# Patient Record
Sex: Male | Born: 1955 | Race: White | Hispanic: No | Smoking: Current every day smoker
Health system: Southern US, Community
[De-identification: ages and names within clinical notes are randomized; demographics above are authoritative.]

## PROBLEM LIST (undated history)

## (undated) DIAGNOSIS — R21 Rash and other nonspecific skin eruption: Secondary | ICD-10-CM

## (undated) DIAGNOSIS — G5603 Carpal tunnel syndrome, bilateral upper limbs: Secondary | ICD-10-CM

## (undated) DIAGNOSIS — IMO0001 Reserved for inherently not codable concepts without codable children: Secondary | ICD-10-CM

## (undated) DIAGNOSIS — G43909 Migraine, unspecified, not intractable, without status migrainosus: Secondary | ICD-10-CM

## (undated) DIAGNOSIS — L0390 Cellulitis, unspecified: Secondary | ICD-10-CM

## (undated) DIAGNOSIS — S2239XA Fracture of one rib, unspecified side, initial encounter for closed fracture: Secondary | ICD-10-CM

## (undated) DIAGNOSIS — R059 Cough, unspecified: Secondary | ICD-10-CM

## (undated) DIAGNOSIS — R05 Cough: Secondary | ICD-10-CM

## (undated) DIAGNOSIS — M419 Scoliosis, unspecified: Secondary | ICD-10-CM

## (undated) HISTORY — PX: NO PAST SURGERIES: SHX2092

---

## 2005-06-10 ENCOUNTER — Emergency Department: Payer: Self-pay | Admitting: General Practice

## 2005-12-13 ENCOUNTER — Emergency Department: Payer: Self-pay | Admitting: Unknown Physician Specialty

## 2005-12-13 ENCOUNTER — Other Ambulatory Visit: Payer: Self-pay

## 2007-01-29 ENCOUNTER — Emergency Department: Payer: Self-pay | Admitting: Emergency Medicine

## 2008-06-11 ENCOUNTER — Emergency Department: Payer: Self-pay | Admitting: Emergency Medicine

## 2014-08-11 ENCOUNTER — Encounter: Payer: Self-pay | Admitting: Medical Oncology

## 2014-08-11 ENCOUNTER — Emergency Department
Admission: EM | Admit: 2014-08-11 | Discharge: 2014-08-11 | Disposition: A | Discharge status: Home / Self Care | Payer: Self-pay | Attending: Emergency Medicine | Admitting: Emergency Medicine

## 2014-08-11 DIAGNOSIS — Z7952 Long term (current) use of systemic steroids: Secondary | ICD-10-CM | POA: Insufficient documentation

## 2014-08-11 DIAGNOSIS — Z880 Allergy status to penicillin: Secondary | ICD-10-CM | POA: Insufficient documentation

## 2014-08-11 DIAGNOSIS — G43009 Migraine without aura, not intractable, without status migrainosus: Secondary | ICD-10-CM | POA: Insufficient documentation

## 2014-08-11 DIAGNOSIS — Z720 Tobacco use: Secondary | ICD-10-CM | POA: Insufficient documentation

## 2014-08-11 DIAGNOSIS — R05 Cough: Secondary | ICD-10-CM | POA: Insufficient documentation

## 2014-08-11 HISTORY — DX: Scoliosis, unspecified: M41.9

## 2014-08-11 HISTORY — DX: Carpal tunnel syndrome, bilateral upper limbs: G56.03

## 2014-08-11 HISTORY — DX: Migraine, unspecified, not intractable, without status migrainosus: G43.909

## 2014-08-11 MED ORDER — KETOROLAC TROMETHAMINE 60 MG/2ML IM SOLN
INTRAMUSCULAR | Status: AC
  Administered 2014-08-11: 60 mg via INTRAMUSCULAR
  Filled 2014-08-11: qty 2

## 2014-08-11 MED ORDER — DIPHENHYDRAMINE HCL 25 MG PO CAPS
ORAL_CAPSULE | ORAL | Status: AC
  Administered 2014-08-11: 50 mg via ORAL
  Filled 2014-08-11: qty 2

## 2014-08-11 MED ORDER — METOCLOPRAMIDE HCL 10 MG PO TABS
ORAL_TABLET | ORAL | Status: AC
Start: 2014-08-11 — End: 2014-08-11
  Administered 2014-08-11: 20 mg via ORAL
  Filled 2014-08-11: qty 2

## 2014-08-11 MED ORDER — METOCLOPRAMIDE HCL 10 MG PO TABS: 10.0000 mg | ORAL_TABLET | Freq: Three times a day (TID) | ORAL | Status: DC

## 2014-08-11 MED ORDER — KETOROLAC TROMETHAMINE 60 MG/2ML IM SOLN
60.0000 mg | Freq: Once | INTRAMUSCULAR | Status: AC
  Administered 2014-08-11: 60 mg via INTRAMUSCULAR

## 2014-08-11 MED ORDER — DIPHENHYDRAMINE HCL 25 MG PO CAPS: 50.0000 mg | ORAL_CAPSULE | Freq: Four times a day (QID) | ORAL | Status: DC | PRN

## 2014-08-11 MED ORDER — METOCLOPRAMIDE HCL 10 MG PO TABS
20.0000 mg | ORAL_TABLET | Freq: Once | ORAL | Status: AC
  Administered 2014-08-11: 20 mg via ORAL

## 2014-08-11 MED ORDER — DIPHENHYDRAMINE HCL 25 MG PO CAPS
50.0000 mg | ORAL_CAPSULE | Freq: Once | ORAL | Status: AC
  Administered 2014-08-11: 50 mg via ORAL

## 2014-08-11 MED ORDER — TRIAMCINOLONE ACETONIDE 0.05 % EX OINT: 1.0000 "application " | TOPICAL_OINTMENT | Freq: Two times a day (BID) | CUTANEOUS | Status: AC

## 2014-08-11 NOTE — ED Notes (Signed)
Pt alert and oriented X4, active, cooperative, pt in NAD. RR even and unlabored, color WNL.  Pt informed to return if any life threatening symptoms occur.  Left with mother. 

## 2014-08-11 NOTE — Discharge Instructions (Signed)

## 2014-08-11 NOTE — ED Notes (Signed)
Pt ambulatory to triage with reports of migraine headache that began last night. Pt reports vomit x 2 since last night.

## 2014-08-11 NOTE — ED Provider Notes (Signed)
[facility] Emergency Department Provider Note  ____________________________________________  Time seen: 2:50 PM  I have reviewed the triage vital signs and the nursing notes.   HISTORY  Chief Complaint Headache and Emesis    HPI Lee Fischer is a 59 y.o. male who complains of recurrent migraine headache since last night. He notes that this migraine headache is exactly like his usual ones characterized by generalized severe headache, intermittent tremor, nausea, vomiting, and feeling like he goes cross eyed. Normally he takes Advil and lays under the covers and his bed and feels better. However, this headache has not improved and apart from being refractory to his usual treatment at home, there are no other features. No numbness tingling or weakness, or vision changes, or sudden thunderclap headache.     Past Medical History  Diagnosis Date  . Migraines   . Scoliosis   . Carpal tunnel syndrome, bilateral     There are no active problems to display for this patient.   History reviewed. No pertinent past surgical history.  Current Outpatient Rx  Name  Route  Sig  Dispense  Refill  . diphenhydrAMINE (BENADRYL) 25 mg capsule   Oral   Take 2 capsules (50 mg total) by mouth every 6 (six) hours as needed.   60 capsule   0   . metoCLOPramide (REGLAN) 10 MG tablet   Oral   Take 1 tablet (10 mg total) by mouth 4 (four) times daily -  before meals and at bedtime.   60 tablet   0   . TRIAMCINOLONE ACETONIDE, TOP, 0.05 % OINT   Apply externally   Apply 1 application topically 2 (two) times daily.   15 g   0     Allergies Penicillins  No family history on file.  Social History History  Substance Use Topics  . Smoking status: Current Every Day Smoker  . Smokeless tobacco: Not on file  . Alcohol Use: No    Review of Systems  Constitutional: No fever or chills. No weight changes Eyes:No blurry vision or double vision.  ENT: No sore  throat. Cardiovascular: No chest pain. Respiratory: Chronic cough due to smoking. Gastrointestinal: Negative for abdominal pain, vomiting and diarrhea.  No BRBPR or melena. Genitourinary: Negative for dysuria, urinary retention, bloody urine, or difficulty urinating. Musculoskeletal: Negative for back pain. No joint swelling or pain. Skin: Negative for rash. Neurological: Headache as above, no weakness or paresthesia Psychiatric:No anxiety or depression.   Endocrine:No hot/cold intolerance, changes in energy, or sleep difficulty.  10-point ROS otherwise negative.  ____________________________________________   PHYSICAL EXAM:  VITAL SIGNS: ED Triage Vitals  Enc Vitals Group     BP 08/11/14 1358 121/73 mmHg     Pulse Rate 08/11/14 1358 91     Resp 08/11/14 1358 18     Temp 08/11/14 1358 100.1 F (37.8 C)     Temp Source 08/11/14 1358 Oral     SpO2 08/11/14 1358 97 %     Weight 08/11/14 1358 132 lb (59.875 kg)     Height 08/11/14 1358 5\' 6"  (1.676 m)     Head Cir --      Peak Flow --      Pain Score 08/11/14 1359 10     Pain Loc --      Pain Edu? --      Excl. in GC? --      Constitutional: Alert and oriented. Well appearing and in no distress. Eyes: No scleral  icterus. No conjunctival pallor. PERRL. EOMI ENT   Head: Normocephalic and atraumatic.   Nose: No congestion/rhinnorhea. No septal hematoma   Mouth/Throat: MMM, no pharyngeal erythema. No peritonsillar mass. No uvula shift.   Neck: No stridor. No SubQ emphysema. No meningismus. Hematological/Lymphatic/Immunilogical: No cervical lymphadenopathy. Cardiovascular: RRR. Normal and symmetric distal pulses are present in all extremities. No murmurs, rubs, or gallops. Respiratory: Normal respiratory effort without tachypnea nor retractions. Breath sounds are clear and equal bilaterally. No wheezes/rales/rhonchi. Gastrointestinal: Soft and nontender. No distention. There is no CVA tenderness.  No rebound,  rigidity, or guarding. Genitourinary: deferred Musculoskeletal: Nontender with normal range of motion in all extremities. No joint effusions.  No lower extremity tenderness.  No edema. Neurologic:   Normal speech and language.  CN 2-10 normal. Motor grossly intact. No pronator drift.  Normal gait. No gross focal neurologic deficits are appreciated.  Skin:  Skin is warm, dry and intact. Small well delineated rectangular shaped rash on the left medial distal shin, consistent with contact dermatitis. No weeping and oozing or crusting. Nontender Psychiatric: Mood and affect are normal. Speech and behavior are normal. Patient exhibits appropriate insight and judgment.  ____________________________________________    LABS (pertinent positives/negatives) (all labs ordered are listed, but only abnormal results are displayed) Labs Reviewed - No data to display ____________________________________________   EKG    ____________________________________________    RADIOLOGY    ____________________________________________   PROCEDURES  ____________________________________________   INITIAL IMPRESSION / ASSESSMENT AND PLAN / ED COURSE  Pertinent labs & imaging results that were available during my care of the patient were reviewed by me and considered in my medical decision making (see chart for details).  Evaluation consistent with migraine headache. Low suspicion of CVA, ICH, meningitis, encephalitis, carotid injury, glaucoma or giant cell arteritis. We'll give him Toradol Benadryl and Reglan and discharged home. In addition, all present prescribed triamcinolone ointment for the contact dermatitis on his left shin. No tick exposure or high risk outdoor activities. ____________________________________________   FINAL CLINICAL IMPRESSION(S) / ED DIAGNOSES  Final diagnoses:  Migraine without aura and without status migrainosus, not intractable   contact dermatitis of left  shin    Sharman Cheek, MD 08/11/14 1506

## 2014-08-14 ENCOUNTER — Encounter: Payer: Self-pay | Admitting: Emergency Medicine

## 2014-08-14 ENCOUNTER — Emergency Department: Payer: Self-pay

## 2014-08-14 ENCOUNTER — Emergency Department
Admission: EM | Admit: 2014-08-14 | Discharge: 2014-08-14 | Disposition: A | Discharge status: Home / Self Care | Payer: Self-pay | Attending: Emergency Medicine | Admitting: Emergency Medicine

## 2014-08-14 DIAGNOSIS — Z79899 Other long term (current) drug therapy: Secondary | ICD-10-CM | POA: Insufficient documentation

## 2014-08-14 DIAGNOSIS — L03116 Cellulitis of left lower limb: Secondary | ICD-10-CM | POA: Insufficient documentation

## 2014-08-14 DIAGNOSIS — Z720 Tobacco use: Secondary | ICD-10-CM | POA: Insufficient documentation

## 2014-08-14 DIAGNOSIS — Z880 Allergy status to penicillin: Secondary | ICD-10-CM | POA: Insufficient documentation

## 2014-08-14 LAB — CBC WITH DIFFERENTIAL/PLATELET
Basophils Absolute: 0.3 10*3/uL — ABNORMAL HIGH (ref 0–0.1)
Basophils Relative: 2 %
Eosinophils Absolute: 0 10*3/uL (ref 0–0.7)
Eosinophils Relative: 0 %
HCT: 45.6 % (ref 40.0–52.0)
Hemoglobin: 15.4 g/dL (ref 13.0–18.0)
Lymphocytes Relative: 18 %
Lymphs Abs: 2.1 10*3/uL (ref 1.0–3.6)
MCH: 32.7 pg (ref 26.0–34.0)
MCHC: 33.7 g/dL (ref 32.0–36.0)
MCV: 96.9 fL (ref 80.0–100.0)
MONO ABS: 0.9 10*3/uL (ref 0.2–1.0)
Monocytes Relative: 8 %
NEUTROS PCT: 72 %
Neutro Abs: 8.5 10*3/uL — ABNORMAL HIGH (ref 1.4–6.5)
PLATELETS: 191 10*3/uL (ref 150–440)
RBC: 4.71 MIL/uL (ref 4.40–5.90)
RDW: 13.2 % (ref 11.5–14.5)
WBC: 11.7 10*3/uL — ABNORMAL HIGH (ref 3.8–10.6)

## 2014-08-14 LAB — COMPREHENSIVE METABOLIC PANEL
ALK PHOS: 70 U/L (ref 38–126)
ALT: 23 U/L (ref 17–63)
AST: 25 U/L (ref 15–41)
Albumin: 4.1 g/dL (ref 3.5–5.0)
Anion gap: 10 (ref 5–15)
BILIRUBIN TOTAL: 0.7 mg/dL (ref 0.3–1.2)
BUN: 22 mg/dL — AB (ref 6–20)
CO2: 29 mmol/L (ref 22–32)
Calcium: 9.2 mg/dL (ref 8.9–10.3)
Chloride: 101 mmol/L (ref 101–111)
Creatinine, Ser: 1.12 mg/dL (ref 0.61–1.24)
GFR calc non Af Amer: >60 mL/min (ref >60)
Glucose, Bld: 105 mg/dL — ABNORMAL HIGH (ref 65–99)
Potassium: 3.2 mmol/L — ABNORMAL LOW (ref 3.5–5.1)
SODIUM: 140 mmol/L (ref 135–145)
Total Protein: 7.9 g/dL (ref 6.5–8.1)

## 2014-08-14 MED ORDER — SULFAMETHOXAZOLE-TRIMETHOPRIM 800-160 MG PO TABS: 1.0000 | ORAL_TABLET | Freq: Two times a day (BID) | ORAL | Status: DC

## 2014-08-14 MED ORDER — MUPIROCIN 2 % EX OINT: TOPICAL_OINTMENT | CUTANEOUS | Status: AC

## 2014-08-14 MED ORDER — SULFAMETHOXAZOLE-TRIMETHOPRIM 800-160 MG PO TABS
1.0000 | ORAL_TABLET | Freq: Once | ORAL | Status: AC
  Administered 2014-08-14: 1 via ORAL

## 2014-08-14 MED ORDER — OXYCODONE-ACETAMINOPHEN 5-325 MG PO TABS: 1.0000 | ORAL_TABLET | Freq: Four times a day (QID) | ORAL | Status: DC | PRN

## 2014-08-14 MED ORDER — SULFAMETHOXAZOLE-TRIMETHOPRIM 800-160 MG PO TABS
ORAL_TABLET | ORAL | Status: AC
  Administered 2014-08-14: 1 via ORAL
  Filled 2014-08-14: qty 1

## 2014-08-14 NOTE — ED Notes (Signed)
Pt redness, swelling and warmth to left lower leg. Cold chills unknown fever.

## 2014-08-14 NOTE — Discharge Instructions (Signed)

## 2014-08-14 NOTE — ED Provider Notes (Signed)
[facility] Emergency Department Provider Note     Time seen: ----------------------------------------- 3:50 PM on 08/14/2014 -----------------------------------------    I have reviewed the triage vital signs and the nursing notes.   HISTORY  Chief Complaint Leg Swelling    HPI Lee Fischer is a 59 y.o. male who presents ER for for warmth and swelling of his left lower leg since February.Patient states over the last few days skin increasingly red, with warmth. Family's concern he has infection there. Recently was seen here and was treated for allergic type dermatitis with steroids and antihistamines. Denies fevers chills or other complaints, but was noted to have a temperature 100.1 here. Symptoms are mild to moderate this time, nothing makes it better or worse.    Past Medical History  Diagnosis Date  . Migraines   . Scoliosis   . Carpal tunnel syndrome, bilateral     There are no active problems to display for this patient.   History reviewed. No pertinent past surgical history.  Current Outpatient Rx  Name  Route  Sig  Dispense  Refill  . diphenhydrAMINE (BENADRYL) 25 mg capsule   Oral   Take 2 capsules (50 mg total) by mouth every 6 (six) hours as needed.   60 capsule   0   . metoCLOPramide (REGLAN) 10 MG tablet   Oral   Take 1 tablet (10 mg total) by mouth 4 (four) times daily -  before meals and at bedtime.   60 tablet   0   . TRIAMCINOLONE ACETONIDE, TOP, 0.05 % OINT   Apply externally   Apply 1 application topically 2 (two) times daily.   15 g   0     Allergies Penicillins  No family history on file.  Social History History  Substance Use Topics  . Smoking status: Current Every Day Smoker -- 1.00 packs/day    Types: Cigarettes  . Smokeless tobacco: Never Used  . Alcohol Use: No    Review of Systems Constitutional: Negative for fever. Eyes: Negative for visual changes. ENT: Negative for sore  throat. Cardiovascular: Negative for chest pain. Respiratory: Negative for shortness of breath. Gastrointestinal: Negative for abdominal pain, vomiting and diarrhea. Genitourinary: Negative for dysuria. Musculoskeletal: Negative for back pain. Skin: Positive for rash and redness to left lower extremity Neurological: Negative for headaches, focal weakness or numbness.  10-point ROS otherwise negative.  ____________________________________________   PHYSICAL EXAM:  VITAL SIGNS: ED Triage Vitals  Enc Vitals Group     BP 08/14/14 1518 123/71 mmHg     Pulse Rate 08/14/14 1518 89     Resp 08/14/14 1518 20     Temp 08/14/14 1518 98.6 F (37 C)     Temp Source 08/14/14 1518 Oral     SpO2 08/14/14 1518 99 %     Weight 08/14/14 1518 140 lb (63.504 kg)     Height 08/14/14 1518  (1.676 m)     Head Cir --      Peak Flow --      Pain Score 08/14/14 1525 10     Pain Loc --      Pain Edu? --      Excl. in GC? --     Constitutional: Alert and oriented. Well appearing and in no distress. Eyes: Conjunctivae are normal. PERRL. Normal extraocular movements. ENT   Head: Normocephalic and atraumatic.   Nose: No congestion/rhinnorhea.   Mouth/Throat: Mucous membranes are moist.   Neck: No stridor. Hematological/Lymphatic/Immunilogical: No  cervical lymphadenopathy. Cardiovascular: Normal rate, regular rhythm. Normal and symmetric distal pulses are present in all extremities. No murmurs, rubs, or gallops. Respiratory: Normal respiratory effort without tachypnea nor retractions. Breath sounds are clear and equal bilaterally. No wheezes/rales/rhonchi. Gastrointestinal: Soft and nontender. No distention. No abdominal bruits. There is no CVA tenderness. Musculoskeletal: Nontender with normal range of motion in all extremities. No joint effusions.  No lower extremity tenderness nor edema. Neurologic:  Normal speech and language. No gross focal neurologic deficits are appreciated.  Speech is normal. No gait instability. Skin:  There is near circular circumferential erythema and left lower extremity just above the ankle., There is also warmth over the area laterally. It's about the size of the palmar surface of the hand. Psychiatric: Mood and affect are normal. Speech and behavior are normal. Patient exhibits appropriate insight and judgment.  ____________________________________________    LABS (pertinent positives/negatives)  Labs Reviewed  CBC WITH DIFFERENTIAL/PLATELET - Abnormal; Notable for the following:    WBC 11.7 (*)    Neutro Abs 8.5 (*)    Basophils Absolute 0.3 (*)    All other components within normal limits  COMPREHENSIVE METABOLIC PANEL - Abnormal; Notable for the following:    Potassium 3.2 (*)    Glucose, Bld 105 (*)    BUN 22 (*)    All other components within normal limits  CULTURE, BLOOD (ROUTINE X 2)  CULTURE, BLOOD (ROUTINE X 2)    ____________________________________________  ED COURSE:  Pertinent labs & imaging results that were available during my care of the patient were reviewed by me and considered in my medical decision making (see chart for details). Patient will receive lab work, cultures, ultrasound.  ____________________________________________   RADIOLOGY  Ultrasound left lower extremity for DVT is negative  IMPRESSION: No evidence of deep venous thrombosis.   ____________________________________________    FINAL ASSESSMENT AND PLAN  Cellulitis   Plan: Patient will continue outpatient follow-up with antibiotics and antibiotic ointment. We'll referred to the wound healing Center for evaluation.    Emily FilbertWilliams, Triana Coover E, MD   Emily FilbertJonathan E Leilanee Righetti, MD 08/14/14 [id number]

## 2014-08-19 LAB — CULTURE, BLOOD (ROUTINE X 2)
CULTURE: NO GROWTH
Culture: NO GROWTH

## 2014-08-29 ENCOUNTER — Encounter: Payer: Self-pay | Attending: Surgery | Admitting: Surgery

## 2014-08-29 DIAGNOSIS — L03116 Cellulitis of left lower limb: Secondary | ICD-10-CM | POA: Insufficient documentation

## 2014-08-29 NOTE — Progress Notes (Signed)
JAYMIAN, BOGART ([id number]) Visit Report for 08/29/2014 Allergy List Details Patient Name: Lee Fischer, Lee Fischer Date of Service: 08/29/2014 2:30 PM Medical Record Number: [id number] Patient Account Number: [id number] Date of Birth/Sex: 1955-06-21 (59 y.o. Male) Treating RN: Clover Mealy, RN, BSN, [place] Sink Primary Care Physician: Other Clinician: Referring Physician: Treating Physician/Extender: Rudene Re in Treatment: 0 Allergies Active Allergies No known drug allergies Allergy Notes Electronic Signature(s) Signed: 08/29/2014 3:19:52 PM By: Elpidio Eric BSN, RN Entered By: Elpidio Eric on 08/29/2014 14:46:14 Lee Fischer ([id number]) -------------------------------------------------------------------------------- Arrival Information Details Patient Name: Lee Fischer Date of Service: 08/29/2014 2:30 PM Medical Record Number: [id number] Patient Account Number: [id number] Date of Birth/Sex: [birth year] (59 y.o. Male) Treating RN: Clover Mealy, RN, BSN, [place] Sink Primary Care Physician: Other Clinician: Referring Physician: Treating Physician/Extender: Rudene Re in Treatment: 0 Visit Information Patient Arrived: Ambulatory Arrival Time: 14:41 Accompanied By: mommy Transfer Assistance: None Patient Identification Verified: Yes Secondary Verification Process Yes Completed: Patient Requires Transmission-Based No Precautions: Patient Has Alerts: No Electronic Signature(s) Signed: 08/29/2014 3:19:52 PM By: Elpidio Eric BSN, RN Entered By: Elpidio Eric on 08/29/2014 14:42:22 Lee Fischer ([id number]) -------------------------------------------------------------------------------- Clinic Level of Care Assessment Details Patient Name: Lee Fischer Date of Service: 08/29/2014 2:30 PM Medical Record Number: [id number] Patient Account Number: [id number] Date of Birth/Sex: 14-Dec-1955 (59 y.o. Male) Treating RN: Curtis Sites Primary Care Physician: Other  Clinician: Referring Physician: Treating Physician/Extender: Rudene Re in Treatment: 0 Clinic Level of Care Assessment Items TOOL 2 Quantity Score  - Use when only an EandM is performed on the INITIAL visit 0 ASSESSMENTS - Nursing Assessment / Reassessment X - General Physical Exam (combine w/ comprehensive assessment (listed just 1 20 below) when performed on new pt. evals) X - Comprehensive Assessment (HX, ROS, Risk Assessments, Wounds Hx, etc.) 1 25 ASSESSMENTS - Wound and Skin Assessment / Reassessment  - Simple Wound Assessment / Reassessment - one wound 0  - Complex Wound Assessment / Reassessment - multiple wounds 0  - Dermatologic / Skin Assessment (not related to wound area) 0 ASSESSMENTS - Ostomy and/or Continence Assessment and Care  - Incontinence Assessment and Management 0  - Ostomy Care Assessment and Management (repouching, etc.) 0 PROCESS - Coordination of Care X - Simple Patient / Family Education for ongoing care 1 15  - Complex (extensive) Patient / Family Education for ongoing care 0  - Staff obtains Chiropractor, Records, Test Results / Process Orders 0  - Staff telephones HHA, Nursing Homes / Clarify orders / etc 0  - Routine Transfer to another Facility (non-emergent condition) 0  - Routine Hospital Admission (non-emergent condition) 0  - New Admissions / Manufacturing engineer / Ordering NPWT, Apligraf, etc. 0  - Emergency Hospital Admission (emergent condition) 0 X - Simple Discharge Coordination 1 10 Lee Fischer, Lee Fischer. ([id number])  - Complex (extensive) Discharge Coordination 0 PROCESS - Special Needs  - Pediatric / Minor Patient Management 0  - Isolation Patient Management 0  - Hearing / Language / Visual special needs 0  - Assessment of Community assistance (transportation, D/C planning, etc.) 0  - Additional assistance / Altered mentation 0  - Support Surface(s) Assessment (bed, cushion, seat, etc.)  0 INTERVENTIONS - Wound Cleansing / Measurement  - Wound Imaging (photographs - any number of wounds) 0  - Wound Tracing (instead of photographs) 0  - Simple Wound Measurement - one wound 0  - Complex Wound Measurement - multiple wounds 0  - Simple Wound Cleansing - one wound 0  -  Complex Wound Cleansing - multiple wounds 0 INTERVENTIONS - Wound Dressings  - Small Wound Dressing one or multiple wounds 0  - Medium Wound Dressing one or multiple wounds 0  - Large Wound Dressing one or multiple wounds 0  - Application of Medications - injection 0 INTERVENTIONS - Miscellaneous  - External ear exam 0  - Specimen Collection (cultures, biopsies, blood, body fluids, etc.) 0  - Specimen(s) / Culture(s) sent or taken to Lab for analysis 0  - Patient Transfer (multiple staff / Michiel Sites Lift / Similar devices) 0  - Simple Staple / Suture removal (25 or less) 0  - Complex Staple / Suture removal (26 or more) 0 Lee Fischer, Lee W. ([id number])  - Hypo / Hyperglycemic Management (close monitor of Blood Glucose) 0  - Ankle / Brachial Index (ABI) - do not check if billed separately 0 Has the patient been seen at the hospital within the last three years: Yes Total Score: 70 Level Of Care: New/Established - Level 2 Electronic Signature(s) Signed: 08/29/2014 3:59:49 PM By: Curtis Sites Entered By: Curtis Sites on 08/29/2014 15:59:49 Lee Fischer ([id number]) -------------------------------------------------------------------------------- Encounter Discharge Information Details Patient Name: Lee Fischer Date of Service: 08/29/2014 2:30 PM Medical Record Number: [id number] Patient Account Number: [id number] Date of Birth/Sex: 09-May-1955 (59 y.o. Male) Treating RN: Primary Care Physician: Other Clinician: Referring Physician: Treating Physician/Extender: Rudene Re in Treatment: 0 Encounter Discharge Information Items Schedule Follow-up  Appointment: No Medication Reconciliation completed and provided to Patient/Care No Deniese Oberry: Provided on Clinical Summary of Care: 08/29/2014 Form Type Recipient Paper Patient JT Electronic Signature(s) Signed: 08/29/2014 3:01:19 PM By: Gwenlyn Perking Entered By: Gwenlyn Perking on 08/29/2014 15:01:19 Lee Fischer ([id number]) -------------------------------------------------------------------------------- Lower Extremity Assessment Details Patient Name: Lee Fischer Date of Service: 08/29/2014 2:30 PM Medical Record Number: [id number] Patient Account Number: [id number] Date of Birth/Sex: 1955-06-04 (59 y.o. Male) Treating RN: Clover Mealy, RN, BSN, [place] Primary Care Physician: Other Clinician: Referring Physician: Treating Physician/Extender: Rudene Re in Treatment: 0 Edema Assessment Assessed: [Left: No] [Right: No] Edema: [Left: No] [Right: No] Vascular Assessment Claudication: Claudication Assessment [Left:None] [Right:None] Pulses: Posterior Tibial Palpable: [Left:Yes] [Right:Yes] Dorsalis Pedis Palpable: [Left:Yes] [Right:Yes] Extremity colors, hair growth, and conditions: Extremity Color: [Left:Red] [Right:Normal] Hair Growth on Extremity: [Left:Yes] [Right:Yes] Temperature of Extremity: [Left:Warm] [Right:Warm] Capillary Refill: [Left:< 3 seconds] [Right:< 3 seconds] Dependent Rubor: [Left:No] [Right:No] Blanched when Elevated: [Left:No] [Right:No] Lipodermatosclerosis: [Left:No] [Right:No] Toe Nail Assessment Left: Right: Thick: No No Discolored: No No Deformed: No No Improper Length and Hygiene: No No Notes NO ABI performed; patient has no wounds or swelling. Electronic Signature(s) Signed: 08/29/2014 3:19:52 PM By: Elpidio Eric BSN, RN Entered By: Elpidio Eric on 08/29/2014 14:58:25 Lee Fischer ([id number]) Lee Fischer, Lee Fischer ([id number]) -------------------------------------------------------------------------------- Pain Assessment  Details Patient Name: Lee Fischer Date of Service: 08/29/2014 2:30 PM Medical Record Number: [id number] Patient Account Number: [id number] Date of Birth/Sex: 1956-03-13 (59 y.o. Male) Treating RN: Clover Mealy, RN, BSN, [place] Sink Primary Care Physician: Other Clinician: Referring Physician: Treating Physician/Extender: Rudene Re in Treatment: 0 Active Problems Location of Pain Severity and Description of Pain Patient Has Paino No Site Locations Pain Management and Medication Current Pain Management: Electronic Signature(s) Signed: 08/29/2014 3:19:52 PM By: Elpidio Eric BSN, RN Entered By: Elpidio Eric on 08/29/2014 14:42:52 Lee Fischer ([id number]) -------------------------------------------------------------------------------- Vitals Details Patient Name: Lee Fischer Date of Service: 08/29/2014 2:30 PM Medical Record Number: [id number] Patient Account Number: [id number] Date of Birth/Sex: [birth year] (60 y.o. Male) Treating RN: Clover Mealy, RN,  BSN, [place] Sink Primary Care Physician: Other Clinician: Referring Physician: Treating Physician/Extender: Rudene Re in Treatment: 0 Vital Signs Time Taken: 14:40 Temperature (F): 97.9 Height (in): 66 Pulse (bpm): 78 Source: Stated Respiratory Rate (breaths/min): 17 Weight (lbs): 131 Blood Pressure (mmHg): 133/76 Source: Stated Reference Range: 80 - 120 mg / dl Body Mass Index (BMI): 21.1 Electronic Signature(s) Signed: 08/29/2014 3:19:52 PM By: Elpidio Eric BSN, RN Entered By: Elpidio Eric on 08/29/2014 14:43:32

## 2014-08-29 NOTE — Progress Notes (Signed)
MALIKIAH, DEBARR ([id number]) Visit Report for 08/29/2014 Abuse/Suicide Risk Screen Details Patient Name: Lee Fischer, Lee Fischer Date of Service: 08/29/2014 2:30 PM Medical Record Number: [id number] Patient Account Number: [id number] Date of Birth/Sex: 11-01-1955 (59 y.o. Male) Treating RN: Clover Mealy, RN, BSN, [place] [employer] Primary Care Physician: Other Clinician: Referring Physician: Treating Physician/Extender: Rudene Re in Treatment: 0 Abuse/Suicide Risk Screen Items Answer ABUSE/SUICIDE RISK SCREEN: Has anyone close to you tried to hurt or harm you recentlyo No Do you feel uncomfortable with anyone in your familyo No Has anyone forced you do things that you didnot want to doo No Do you have any thoughts of harming yourselfo No Patient displays signs or symptoms of abuse and/or neglect. No Electronic Signature(s) Signed: 08/29/2014 3:19:52 PM By: Elpidio Eric BSN, RN Entered By: Elpidio Eric on 08/29/2014 14:45:58 Lee Fischer ([id number]) -------------------------------------------------------------------------------- Activities of Daily Living Details Patient Name: Lee Fischer Date of Service: 08/29/2014 2:30 PM Medical Record Number: [id number] Patient Account Number: [id number] Date of Birth/Sex: 06-02-1955 (59 y.o. Male) Treating RN: Clover Mealy, RN, BSN, [place] [employer] Primary Care Physician: Other Clinician: Referring Physician: Treating Physician/Extender: Rudene Re in Treatment: 0 Activities of Daily Living Items Answer Activities of Daily Living (Please select one for each item) Drive Automobile Completely Able Take Medications Completely Able Use Telephone Completely Able Care for Appearance Completely Able Use Toilet Completely Able Bath / Shower Completely Able Dress Self Completely Able Feed Self Completely Able Walk Completely Able Get In / Out Bed Completely Able Housework Completely Able Prepare Meals Completely Able Handle Money Completely Able Shop for  Self Completely Able Electronic Signature(s) Signed: 08/29/2014 3:19:52 PM By: Elpidio Eric BSN, RN Entered By: Elpidio Eric on 08/29/2014 14:45:47 Lee Fischer ([id number]) -------------------------------------------------------------------------------- Education Assessment Details Patient Name: Lee Fischer Date of Service: 08/29/2014 2:30 PM Medical Record Number: [id number] Patient Account Number: [id number] Date of Birth/Sex: [birth year] (59 y.o. Male) Treating RN: Clover Mealy, RN, BSN, [place] [employer] Primary Care Physician: Other Clinician: Referring Physician: Treating Physician/Extender: Rudene Re in Treatment: 0 Primary Learner Assessed: Patient Learning Preferences/Education Level/Primary Language Learning Preference: Explanation Highest Education Level: High School Preferred Language: English Cognitive Barrier Assessment/Beliefs Language Barrier: No Physical Barrier Assessment Impaired Vision: Yes Glasses Knowledge/Comprehension Assessment Knowledge Level: High Comprehension Level: High Ability to understand written High instructions: Ability to understand verbal High instructions: Motivation Assessment Anxiety Level: Calm Cooperation: Cooperative Education Importance: Acknowledges Need Interest in Health Problems: Asks Questions Perception: Coherent Willingness to Engage in Self- High Management Activities: Readiness to Engage in Self- High Management Activities: Electronic Signature(s) Signed: 08/29/2014 3:19:52 PM By: Elpidio Eric BSN, RN Entered By: Elpidio Eric on 08/29/2014 14:45:26 Lee Fischer ([id number]) -------------------------------------------------------------------------------- Fall Risk Assessment Details Patient Name: Lee Fischer Date of Service: 08/29/2014 2:30 PM Medical Record Number: [id number] Patient Account Number: [id number] Date of Birth/Sex: [birth year] (59 y.o. Male) Treating RN: Clover Mealy, RN, BSN, [place] Primary Care  Physician: Other Clinician: Referring Physician: Treating Physician/Extender: Rudene Re in Treatment: 0 Fall Risk Assessment Items FALL RISK ASSESSMENT: History of falling - immediate or within 3 months 0 No Secondary diagnosis 0 No Ambulatory aid None/bed rest/wheelchair/nurse 0 Yes Crutches/cane/walker 0 No Furniture 0 No IV Access/Saline Lock 0 No Gait/Training Normal/bed rest/immobile 0 Yes Weak 0 No Impaired 0 No Mental Status Oriented to own ability 0 No Electronic Signature(s) Signed: 08/29/2014 3:19:52 PM By: Elpidio Eric BSN, RN Entered By: Elpidio Eric on 08/29/2014 14:44:56 Lee Fischer ([id number]) -------------------------------------------------------------------------------- Foot Assessment Details Patient Name: Lee Fischer. Date of  Service: 08/29/2014 2:30 PM Medical Record Number: [id number] Patient Account Number: [id number] Date of Birth/Sex: [birth year] (59 y.o. Male) Treating RN: Clover Mealy, RN, BSN, [place] Primary Care Physician: Other Clinician: Referring Physician: Treating Physician/Extender: Rudene Re in Treatment: 0 Foot Assessment Items Site Locations + = Sensation present, - = Sensation absent, C = Callus, U = Ulcer R = Redness, W = Warmth, M = Maceration, PU = Pre-ulcerative lesion F = Fissure, S = Swelling, D = Dryness Assessment Right: Left: Other Deformity: No No Prior Foot Ulcer: No No Prior Amputation: No No Charcot Joint: No No Ambulatory Status: Ambulatory Without Help Gait: Steady Electronic Signature(s) Signed: 08/29/2014 3:19:52 PM By: Elpidio Eric BSN, RN Entered By: Elpidio Eric on 08/29/2014 14:44:33 Lee Fischer ([id number]) -------------------------------------------------------------------------------- Nutrition Risk Assessment Details Patient Name: Lee Fischer Date of Service: 08/29/2014 2:30 PM Medical Record Number: [id number] Patient Account Number: [id number] Date of Birth/Sex:  02/01/1956 (59 y.o. Male) Treating RN: Clover Mealy, RN, BSN, [place] Primary Care Physician: Other Clinician: Referring Physician: Treating Physician/Extender: Rudene Re in Treatment: 0 Height (in): 66 Weight (lbs): 131 Body Mass Index (BMI): 21.1 Nutrition Risk Assessment Items NUTRITION RISK SCREEN: I have an illness or condition that made me change the kind and/or 0 No amount of food I eat I eat fewer than two meals per day 0 No I eat few fruits and vegetables, or milk products 0 No I have three or more drinks of beer, liquor or wine almost every day 0 No I have tooth or mouth problems that make it hard for me to eat 0 No I don't always have enough money to buy the food I need 0 No I eat alone most of the time 0 No I take three or more different prescribed or over-the-counter drugs a 0 No day Without wanting to, I have lost or gained 10 pounds in the last six 0 No months I am not always physically able to shop, cook and/or feed myself 0 No Nutrition Protocols Good Risk Protocol 0 No interventions needed Moderate Risk Protocol Electronic Signature(s) Signed: 08/29/2014 3:19:52 PM By: Elpidio Eric BSN, RN Entered By: Elpidio Eric on 08/29/2014 14:44:42

## 2014-08-29 NOTE — Progress Notes (Signed)
[name], [name] ([id number]) Visit Report for 08/29/2014 Chief Complaint Document Details Patient Name: Lee Fischer, Lee Fischer Date of Service: 08/29/2014 2:30 PM Medical Record Number: [id number] Patient Account Number: [id number] Date of Birth/Sex: 1955-05-30 (59 y.o. Male) Treating RN: Primary Care Physician: Other Clinician: Referring Physician: Treating Physician/Extender: Rudene Re in Treatment: 0 Information Obtained from: Patient Chief Complaint Patient presents to the wound care center for a consult due non healing wound. He was seen in the ER recently on 08/14/2014 and sent for swelling of the left leg with possible cellulitis. He has no open wounds. Electronic Signature(s) Signed: 08/29/2014 4:38:19 PM By: Evlyn Kanner MD, FACS Entered By: Evlyn Kanner on 08/29/2014 15:08:19 Lee Fischer ([id number]) -------------------------------------------------------------------------------- HPI Details Patient Name: Lee Fischer Date of Service: 08/29/2014 2:30 PM Medical Record Number: [id number] Patient Account Number: [id number] Date of Birth/Sex: Nov 19, 1955 (59 y.o. Male) Treating RN: Primary Care Physician: Other Clinician: Referring Physician: Treating Physician/Extender: Rudene Re in Treatment: 0 History of Present Illness Location: a swelling and redness of his left lower extremity Quality: Patient reports experiencing a dull pain to affected area(s). Severity: Patient states swelling are getting better. Duration: Patient has had the swelling for < 2 weeks prior to presenting for treatment Timing: Pain in the limb is Intermittent (comes and goes Context: The swelling appeared gradually over time Modifying Factors: Consults to this date include: seen in the ER and had a workup and put on antibiotics Associated Signs and Symptoms: Patient reports having difficulty standing for long periods. HPI Description: The patient presents with swelling of  his left lower extremity on and off since February. He says at some stage his dog may have scratched him and because of that he had some redness and swelling. He was given antibiotics by the ER a while ago and this has helped him immensely. Was seen recently in the ER on 08/14/2014 for a cellulitis of the left lower extremity. A DVT study was done on that day and there was no evidence of DVT of the left lower extremity. His white count was normal and his other labs are within normal limits. past medical history significant of migraines, scoliosis, bilateral carpal tunnel syndrome. He smokes about a pack of cigarettes a day. He has not seen her family doctor for over a year. Electronic Signature(s) Signed: 08/29/2014 4:38:19 PM By: Evlyn Kanner MD, FACS Entered By: Evlyn Kanner on 08/29/2014 15:08:26 Lee Fischer ([id number]) -------------------------------------------------------------------------------- Physical Exam Details Patient Name: Lee Fischer Date of Service: 08/29/2014 2:30 PM Medical Record Number: [id number] Patient Account Number: [id number] Date of Birth/Sex: 12-21-1955 (59 y.o. Male) Treating RN: Primary Care Physician: Other Clinician: Referring Physician: Treating Physician/Extender: Rudene Re in Treatment: 0 Constitutional . Pulse regular. Respirations normal and unlabored. Afebrile. . Eyes Nonicteric. Reactive to light. Ears, Nose, Mouth, and Throat Lips, teeth, and gums WNL.Marland Kitchen Moist mucosa without lesions . Neck supple and nontender. No palpable supraclavicular or cervical adenopathy. Normal sized without goiter. Respiratory WNL. No retractions.. . Cardiovascular . Pedal Pulses WNL. No clubbing, cyanosis or edema. no cellulitis.. Musculoskeletal Adexa without tenderness or enlargement.. Digits and nails w/o clubbing, cyanosis, infection, petechiae, ischemia, or inflammatory conditions.. Integumentary (Hair, Skin) No suspicious lesions.  no open wounds and no evidence of any cellulitis.. No crepitus or fluctuance. No peri-wound warmth or erythema. No masses.Marland Kitchen Psychiatric Judgement and insight Intact.. No evidence of depression, anxiety, or agitation.. Electronic Signature(s) Signed: 08/29/2014 4:38:19 PM By: Evlyn Kanner MD, FACS Entered By: Evlyn Kanner on  08/29/2014 15:09:05 Lee Fischer, Lee Fischer ([id number]) -------------------------------------------------------------------------------- Physician Orders Details Patient Name: Lee Fischer Date of Service: 08/29/2014 2:30 PM Medical Record Number: [id number] Patient Account Number: [id number] Date of Birth/Sex: 1955-06-13 (59 y.o. Male) Treating RN: Clover Mealy, RN, BSN, [place] Primary Care Physician: Other Clinician: Referring Physician: Treating Physician/Extender: Rudene Re in Treatment: 0 Verbal / Phone Orders: Yes Clinician: Afful, RN, BSN, Rita Read Back and Verified: Yes Diagnosis Coding Discharge From [facility] Services o Discharge from Wound Care Center - Consult Electronic Signature(s) Signed: 08/29/2014 3:19:52 PM By: Elpidio Eric BSN, RN Signed: 08/29/2014 4:38:19 PM By: Evlyn Kanner MD, FACS Entered By: Elpidio Eric on 08/29/2014 14:59:59 Lee Fischer ([id number]) -------------------------------------------------------------------------------- Problem List Details Patient Name: Lee Fischer Date of Service: 08/29/2014 2:30 PM Medical Record Number: [id number] Patient Account Number: [id number] Date of Birth/Sex: Oct 08, 1955 (59 y.o. Male) Treating RN: Primary Care Physician: Other Clinician: Referring Physician: Treating Physician/Extender: Rudene Re in Treatment: 0 Active Problems ICD-10 Encounter Code Description Active Date Diagnosis L03.116 Cellulitis of left lower limb 08/29/2014 Yes Inactive Problems Resolved Problems Electronic Signature(s) Signed: 08/29/2014 4:38:19 PM By: Evlyn Kanner MD, FACS Entered By: Evlyn Kanner on 08/29/2014 15:07:42 Lee Fischer ([id number]) -------------------------------------------------------------------------------- Progress Note Details Patient Name: Lee Fischer Date of Service: 08/29/2014 2:30 PM Medical Record Number: [id number] Patient Account Number: [id number] Date of Birth/Sex: [birth year] (59 y.o. Male) Treating RN: Primary Care Physician: Other Clinician: Referring Physician: Treating Physician/Extender: Rudene Re in Treatment: 0 Subjective Chief Complaint Information obtained from Patient Patient presents to the wound care center for a consult due non healing wound. He was seen in the ER recently on 08/14/2014 and sent for swelling of the left leg with possible cellulitis. He has no open wounds. History of Present Illness (HPI) The following HPI elements were documented for the patient's wound: Location: a swelling and redness of his left lower extremity Quality: Patient reports experiencing a dull pain to affected area(s). Severity: Patient states swelling are getting better. Duration: Patient has had the swelling for < 2 weeks prior to presenting for treatment Timing: Pain in the limb is Intermittent (comes and goes Context: The swelling appeared gradually over time Modifying Factors: Consults to this date include: seen in the ER and had a workup and put on antibiotics Associated Signs and Symptoms: Patient reports having difficulty standing for long periods. The patient presents with swelling of his left lower extremity on and off since February. He says at some stage his dog may have scratched him and because of that he had some redness and swelling. He was given antibiotics by the ER a while ago and this has helped him immensely. Was seen recently in the ER on 08/14/2014 for a cellulitis of the left lower extremity. A DVT study was done on that day and there was no evidence of DVT of the left lower extremity. His white count was  normal and his other labs are within normal limits. past medical history significant of migraines, scoliosis, bilateral carpal tunnel syndrome. He smokes about a pack of cigarettes a day. He has not seen her family doctor for over a year. Wound History Patient reportedly has not tested positive for osteomyelitis. Patient experiences the following problems associated with their wounds: swelling. Patient History Information obtained from Patient. Allergies No known drug allergies Lee Fischer, Lee Fischer ([id number]) Family History Hypertension - Mother, Lung Disease - Mother, No family history of Cancer, Diabetes, Heart Disease, Hereditary Spherocytosis, Kidney Disease, Seizures, Stroke, Thyroid Problems, Tuberculosis.  Social History Former smoker, Marital Status - Single, Alcohol Use - Never, Drug Use - No History, Caffeine Use - Never. Medical History Eyes Denies history of Cataracts, Glaucoma, Optic Neuritis Ear/Nose/Mouth/Throat Denies history of Chronic sinus problems/congestion, Middle ear problems Hematologic/Lymphatic Denies history of Anemia, Hemophilia, Human Immunodeficiency Virus, Lymphedema, Sickle Cell Disease Respiratory Denies history of Aspiration, Asthma, Chronic Obstructive Pulmonary Disease (COPD), Pneumothorax, Sleep Apnea, Tuberculosis Cardiovascular Denies history of Angina, Arrhythmia, Congestive Heart Failure, Coronary Artery Disease, Deep Vein Thrombosis, Hypertension, Hypotension, Myocardial Infarction, Peripheral Arterial Disease, Peripheral Venous Disease, Phlebitis, Vasculitis Gastrointestinal Denies history of Cirrhosis , Colitis, Crohn s, Hepatitis A, Hepatitis B, Hepatitis C Endocrine Denies history of Type I Diabetes, Type II Diabetes Genitourinary Denies history of End Stage Renal Disease Immunological Denies history of Lupus Erythematosus, Raynaud s, Scleroderma Integumentary (Skin) Denies history of History of Burn, History of pressure  wounds Musculoskeletal Denies history of Gout, Rheumatoid Arthritis, Osteoarthritis, Osteomyelitis Neurologic Denies history of Dementia, Neuropathy, Quadriplegia, Paraplegia, Seizure Disorder Oncologic Denies history of Received Chemotherapy, Received Radiation Psychiatric Denies history of Anorexia/bulimia, Confinement Anxiety Review of Systems (ROS) Constitutional Symptoms (General Health) The patient has no complaints or symptoms. Eyes The patient has no complaints or symptoms. Ear/Nose/Mouth/Throat The patient has no complaints or symptoms. Hematologic/Lymphatic The patient has no complaints or symptoms. Lee Fischer, Lee Fischer ([id number]) Respiratory The patient has no complaints or symptoms. Cardiovascular The patient has no complaints or symptoms. Gastrointestinal The patient has no complaints or symptoms. Endocrine The patient has no complaints or symptoms. Genitourinary The patient has no complaints or symptoms. Immunological The patient has no complaints or symptoms. Integumentary (Skin) The patient has no complaints or symptoms. Musculoskeletal The patient has no complaints or symptoms, scoliosis, carpel tunnel syndrome Neurologic The patient has no complaints or symptoms. Oncologic The patient has no complaints or symptoms. Psychiatric The patient has no complaints or symptoms. Medications: I have reviewed his list of medications and note that he is on Benadryl, Reglan, Bactroban, Roxicet, Bactrim DS. Objective Constitutional Pulse regular. Respirations normal and unlabored. Afebrile. Vitals Time Taken: 2:40 PM, Height: 66 in, Source: Stated, Weight: 131 lbs, Source: Stated, BMI: 21.1, Temperature: 97.9 F, Pulse: 78 bpm, Respiratory Rate: 17 breaths/min, Blood Pressure: 133/76 mmHg. Eyes Nonicteric. Reactive to light. Ears, Nose, Mouth, and Throat Lips, teeth, and gums WNL.Marland Kitchen Moist mucosa without lesions . Neck supple and nontender. No palpable  supraclavicular or cervical adenopathy. Normal sized without goiter. Lee Fischer, Lee Fischer ([id number]) Respiratory WNL. No retractions.. Cardiovascular Pedal Pulses WNL. No clubbing, cyanosis or edema. no cellulitis.. Musculoskeletal Adexa without tenderness or enlargement.. Digits and nails w/o clubbing, cyanosis, infection, petechiae, ischemia, or inflammatory conditions.Marland Kitchen Psychiatric Judgement and insight Intact.. No evidence of depression, anxiety, or agitation.. Integumentary (Hair, Skin) No suspicious lesions. no open wounds and no evidence of any cellulitis.. No crepitus or fluctuance. No peri-wound warmth or erythema. No masses.. Assessment Active Problems ICD-10 L03.116 - Cellulitis of left lower limb This gentleman was seen in the ER over 2 weeks ago and treated for left lower extremity cellulitis and swelling. For some reason he was referred to the wound center though he does not have any open wounds. At the present time he has no evidence of any swelling or pain in the leg and there is no evidence of any open wounds. I have asked him to establish with a PCP and continue treatment as before. Does not need to follow-up with the wound care service. Plan Discharge From [facility] Services: Discharge from Wound Care Center -  Consult Lee Fischer, Lee Fischer ([id number]) This gentleman was seen in the ER over 2 weeks ago and treated for left lower extremity cellulitis and swelling. For some reason he was referred to the wound center though he does not have any open wounds. At the present time he has no evidence of any swelling or pain in the leg and there is no evidence of any open wounds. I have asked him to establish with a PCP and continue treatment as before. Does not need to follow-up with the wound care service. Electronic Signature(s) Signed: 08/29/2014 4:38:19 PM By: Evlyn Kanner MD, FACS Entered By: Evlyn Kanner on 08/29/2014 15:12:13 Lee Fischer  ([id number]) -------------------------------------------------------------------------------- ROS/PFSH Details Patient Name: Lee Fischer Date of Service: 08/29/2014 2:30 PM Medical Record Number: [id number] Patient Account Number: [id number] Date of Birth/Sex: February 02, 1956 (59 y.o. Male) Treating RN: Afful, RN, BSN, Rita Primary Care Physician: Other Clinician: Referring Physician: Treating Physician/Extender: Rudene Re in Treatment: 0 Information Obtained From Patient Wound History Do you currently have one or more open woundso No Have you tested positive for osteomyelitis (bone infection)o No Have you had other problems associated with your woundso Swelling Constitutional Symptoms (General Health) Complaints and Symptoms: No Complaints or Symptoms Eyes Complaints and Symptoms: No Complaints or Symptoms Medical History: Negative for: Cataracts; Glaucoma; Optic Neuritis Ear/Nose/Mouth/Throat Complaints and Symptoms: No Complaints or Symptoms Medical History: Negative for: Chronic sinus problems/congestion; Middle ear problems Hematologic/Lymphatic Complaints and Symptoms: No Complaints or Symptoms Medical History: Negative for: Anemia; Hemophilia; Human Immunodeficiency Virus; Lymphedema; Sickle Cell Disease Respiratory Complaints and Symptoms: No Complaints or Symptoms Medical History: Negative for: Aspiration; Asthma; Chronic Obstructive Pulmonary Disease (COPD); Pneumothorax; Sleep Lee Fischer, Lee Fischer. ([id number]) Apnea; Tuberculosis Cardiovascular Complaints and Symptoms: No Complaints or Symptoms Medical History: Negative for: Angina; Arrhythmia; Congestive Heart Failure; Coronary Artery Disease; Deep Vein Thrombosis; Hypertension; Hypotension; Myocardial Infarction; Peripheral Arterial Disease; Peripheral Venous Disease; Phlebitis; Vasculitis Gastrointestinal Complaints and Symptoms: No Complaints or Symptoms Medical History: Negative for:  Cirrhosis ; Colitis; Crohnos; Hepatitis A; Hepatitis B; Hepatitis C Endocrine Complaints and Symptoms: No Complaints or Symptoms Medical History: Negative for: Type I Diabetes; Type II Diabetes Genitourinary Complaints and Symptoms: No Complaints or Symptoms Medical History: Negative for: End Stage Renal Disease Immunological Complaints and Symptoms: No Complaints or Symptoms Medical History: Negative for: Lupus Erythematosus; Raynaudos; Scleroderma Integumentary (Skin) Complaints and Symptoms: No Complaints or Symptoms Medical History: Negative for: History of Burn; History of pressure wounds BARTH, TRELLA ([id number]) Musculoskeletal Complaints and Symptoms: No Complaints or Symptoms Complaints and Symptoms: Review of System Notes: scoliosis, carpel tunnel syndrome Medical History: Negative for: Gout; Rheumatoid Arthritis; Osteoarthritis; Osteomyelitis Neurologic Complaints and Symptoms: No Complaints or Symptoms Medical History: Negative for: Dementia; Neuropathy; Quadriplegia; Paraplegia; Seizure Disorder Oncologic Complaints and Symptoms: No Complaints or Symptoms Medical History: Negative for: Received Chemotherapy; Received Radiation Psychiatric Complaints and Symptoms: No Complaints or Symptoms Medical History: Negative for: Anorexia/bulimia; Confinement Anxiety Family and Social History Cancer: No; Diabetes: No; Heart Disease: No; Hereditary Spherocytosis: No; Hypertension: Yes - Mother; Kidney Disease: No; Lung Disease: Yes - Mother; Seizures: No; Stroke: No; Thyroid Problems: No; Tuberculosis: No; Former smoker; Marital Status - Single; Alcohol Use: Never; Drug Use: No History; Caffeine Use: Never; Financial Concerns: No; Food, Clothing or Shelter Needs: No; Support System Lacking: No; Transportation Concerns: No; Advanced Directives: No; Patient does not want information on Advanced Directives; Living Will: No Physician Affirmation I have reviewed  and agree with the above information. Electronic Signature(s) Signed: 08/29/2014 3:19:52  PM By: Elpidio Eric BSN, RN Signed: 08/29/2014 4:38:19 PM By: Evlyn Kanner MD, FACS Entered By: Evlyn Kanner on 08/29/2014 15:09:24 Lee Fischer, Lee Fischer ([id number], Lee Fischer ([id number]) -------------------------------------------------------------------------------- SuperBill Details Patient Name: Lee Fischer Date of Service: 08/29/2014 Medical Record Number: [id number] Patient Account Number: [id number] Date of Birth/Sex: 20-Nov-1955 (59 y.o. Male) Treating RN: Primary Care Physician: Other Clinician: Referring Physician: Treating Physician/Extender: Rudene Re in Treatment: 0 Diagnosis Coding ICD-10 Codes Code Description L03.116 Cellulitis of left lower limb Facility Procedures CPT4 Code: [id number] Description: [phone] - WOUND CARE VISIT-LEV 2 EST PT Modifier: Quantity: 1 Physician Procedures CPT4 Code: [id number] Description: WC PHYS LEVEL 3 o NEW PT ICD-10 Description Diagnosis L03.116 Cellulitis of left lower limb Modifier: Quantity: 1 Electronic Signature(s) Signed: 08/29/2014 4:00:00 PM By: Curtis Sites Signed: 08/29/2014 4:38:19 PM By: Evlyn Kanner MD, FACS Entered By: Curtis Sites on 08/29/2014 16:00:00

## 2015-03-19 DIAGNOSIS — L0390 Cellulitis, unspecified: Secondary | ICD-10-CM

## 2015-03-19 HISTORY — DX: Cellulitis, unspecified: L03.90

## 2015-05-17 DIAGNOSIS — S2249XA Multiple fractures of ribs, unspecified side, initial encounter for closed fracture: Secondary | ICD-10-CM

## 2015-05-17 HISTORY — DX: Multiple fractures of ribs, unspecified side, initial encounter for closed fracture: S22.49XA

## 2015-07-21 ENCOUNTER — Emergency Department: Payer: BLUE CROSS/BLUE SHIELD

## 2015-07-21 ENCOUNTER — Emergency Department
Admission: EM | Admit: 2015-07-21 | Discharge: 2015-07-21 | Disposition: A | Discharge status: Home / Self Care | Payer: BLUE CROSS/BLUE SHIELD | Attending: Emergency Medicine | Admitting: Emergency Medicine

## 2015-07-21 DIAGNOSIS — S3992XA Unspecified injury of lower back, initial encounter: Secondary | ICD-10-CM | POA: Diagnosis present

## 2015-07-21 DIAGNOSIS — F17210 Nicotine dependence, cigarettes, uncomplicated: Secondary | ICD-10-CM | POA: Diagnosis not present

## 2015-07-21 DIAGNOSIS — R202 Paresthesia of skin: Secondary | ICD-10-CM

## 2015-07-21 DIAGNOSIS — Y999 Unspecified external cause status: Secondary | ICD-10-CM | POA: Insufficient documentation

## 2015-07-21 DIAGNOSIS — Y939 Activity, unspecified: Secondary | ICD-10-CM | POA: Diagnosis not present

## 2015-07-21 DIAGNOSIS — Z792 Long term (current) use of antibiotics: Secondary | ICD-10-CM | POA: Insufficient documentation

## 2015-07-21 DIAGNOSIS — Y92411 Interstate highway as the place of occurrence of the external cause: Secondary | ICD-10-CM | POA: Insufficient documentation

## 2015-07-21 DIAGNOSIS — Z79899 Other long term (current) drug therapy: Secondary | ICD-10-CM | POA: Insufficient documentation

## 2015-07-21 DIAGNOSIS — G9520 Unspecified cord compression: Secondary | ICD-10-CM | POA: Diagnosis not present

## 2015-07-21 MED ORDER — OXYCODONE-ACETAMINOPHEN 5-325 MG PO TABS
2.0000 | ORAL_TABLET | Freq: Once | ORAL | Status: AC
  Administered 2015-07-21: 2 via ORAL
  Filled 2015-07-21: qty 2
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
## 2015-07-21 NOTE — ED Notes (Signed)
Pt returned from MRI, walking around room in no acute distress.

## 2015-07-21 NOTE — ED Notes (Signed)
Patient transported to MRI 

## 2015-07-21 NOTE — ED Notes (Signed)
Pt offered to order rib xray, declines; states that he needs his whole body scanned.

## 2015-07-21 NOTE — ED Notes (Signed)
md into speak with pt
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
## 2015-07-21 NOTE — ED Notes (Signed)
Pt texting on phone in no acute distress.  
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
## 2015-07-21 NOTE — ED Notes (Signed)
Pt updated on mri wait time.
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
## 2015-07-21 NOTE — ED Notes (Signed)
Pain c/o of neck pain that radiates down left arm to hand. Pt also reports pain radiates down left leg.

## 2015-07-21 NOTE — Discharge Instructions (Signed)
°  You have been seen in the emergency department for arm numbness. Your MRI has shown a significant cord compression of the cervical spine. As we discussed this could lead to paralysis. We recommended that this be evaluated emergently by a neurosurgeon however you have elected to go home at this time. As we discussed if you change your mind and would like to have this worked up further please follow-up with neurosurgery either at [facility] or [facility], or return to our hospital and we will arrange transfer to a hospital with neurosurgery capabilities. Return to the emergency department for any worsening weakness, numbness, or any other symptom personally concerning to your self.   Paresthesia Paresthesia is a burning or prickling feeling. This feeling can happen in any part of the body. It often happens in the hands, arms, legs, or feet. Usually, it is not painful. In most cases, the feeling goes away in a short time and is not a sign of a serious problem. HOME CARE  Avoid drinking alcohol.  Try massage or needle therapy (acupuncture) to help with your problems.  Keep all follow-up visits as told by your doctor. This is important. GET HELP IF:  You keep on having episodes of paresthesia.  Your burning or prickling feeling gets worse when you walk.  You have pain or cramps.  You feel dizzy.  You have a rash. GET HELP RIGHT AWAY IF:  You feel weak.  You have trouble walking or moving.  You have problems speaking, understanding, or seeing.  You feel confused.  You cannot control when you pee (urinate) or poop (bowel movement).  You lose feeling (numbness) after an injury.  You pass out (faint).   This information is not intended to replace advice given to you by your health care provider. Make sure you discuss any questions you have with your health care provider.   Document Released: 02/15/2008 Document Revised: 07/19/2014 Document Reviewed: 02/28/2014 Elsevier  Interactive Patient Education [employer]2016 Elsevier Inc.

## 2015-08-22 ENCOUNTER — Other Ambulatory Visit: Payer: Self-pay | Admitting: Orthopedic Surgery

## 2015-08-25 ENCOUNTER — Other Ambulatory Visit: Payer: Self-pay | Admitting: Orthopedic Surgery

## 2015-08-25 NOTE — H&P (Signed)
     PREOPERATIVE H&P  Chief Complaint: neck pain, left arm pain  HPI: Saunders GlanceJames W Boldman is a 60 y.o. 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  Patient has failed multiple forms of conservative care and continues to have pain (see office notes for additional details regarding the patient's full course of treatment)  Past Medical History  Diagnosis Date  . Migraines   . Scoliosis   . Carpal tunnel syndrome, bilateral    No past surgical history on file. Social History   Social History  . Marital Status: Divorced    Spouse Name: N/A  . Number of Children: N/A  . Years of Education: N/A   Social History Main Topics  . Smoking status: Current Every Day Smoker -- 1.00 packs/day    Types: Cigarettes  . Smokeless tobacco: Never Used  . Alcohol Use: No  . Drug Use: No  . Sexual Activity: No   Other Topics Concern  . Not on file   Social History Narrative   No family history on file. Allergies  Allergen Reactions  . Penicillins Rash    Has patient had a PCN reaction causing immediate rash, facial/tongue/throat swelling, SOB or lightheadedness with hypotension: No Has patient had a PCN reaction causing severe rash involving mucus membranes or skin necrosis: No Has patient had a PCN reaction that required hospitalization NO Has patient had a PCN reaction occurring within the last 10 years: NO If all of the above answers are "NO", then may proceed with Cephalosporin use.   Prior to Admission medications   Not on File     All other systems have been reviewed and were otherwise negative with the exception of those mentioned in the HPI and as above.  Physical Exam: There were no vitals filed for this visit.  General: Alert, no acute distress Cardiovascular: No pedal edema Respiratory: No cyanosis, no use of accessory musculature Skin: No lesions in the area of chief  complaint Neurologic: Sensation intact distally Psychiatric: Patient is competent for consent with normal mood and affect Lymphatic: No axillary or cervical lymphadenopathy  MUSCULOSKELETAL: + hoffman's sign bilaterally  Assessment/Plan: Severe SCC and left cervical radiculoathy  Plan for Procedure(s): ANTERIOR CERVICAL DECOMPRESSION FUSION, CERVICAL 5-6, CERVICAL 6-7 WITH CERVICAL 4-5, 5-, 6-7 INSTRUMENTATION AND ALLOGRAFT; CERVICAL 6 CORPECTOMY, POSTERIOR SPINAL FUSION, CERVICAL 4-5, 5-6, 6-7 WITH INSTRUMENTATION, ALLOGRAFT.   Emilee HeroUMONSKI,Darcell Yacoub LEONARD, MD 08/25/2015 8:18 AM

## 2015-08-29 ENCOUNTER — Ambulatory Visit (HOSPITAL_COMMUNITY)
Admission: RE | Admit: 2015-08-29 | Discharge: 2015-08-29 | Disposition: A | Discharge status: Home / Self Care | Payer: BLUE CROSS/BLUE SHIELD | Source: Ambulatory Visit | Attending: Orthopedic Surgery | Admitting: Orthopedic Surgery

## 2015-08-29 ENCOUNTER — Encounter (HOSPITAL_COMMUNITY)
Admission: RE | Admit: 2015-08-29 | Discharge: 2015-08-29 | Disposition: A | Discharge status: Home / Self Care | Payer: BLUE CROSS/BLUE SHIELD | Source: Ambulatory Visit | Attending: Orthopedic Surgery | Admitting: Orthopedic Surgery

## 2015-08-29 ENCOUNTER — Encounter (HOSPITAL_COMMUNITY): Payer: Self-pay

## 2015-08-29 ENCOUNTER — Other Ambulatory Visit (HOSPITAL_COMMUNITY): Payer: Self-pay | Admitting: *Deleted

## 2015-08-29 DIAGNOSIS — R21 Rash and other nonspecific skin eruption: Secondary | ICD-10-CM

## 2015-08-29 DIAGNOSIS — M4185 Other forms of scoliosis, thoracolumbar region: Secondary | ICD-10-CM | POA: Diagnosis not present

## 2015-08-29 DIAGNOSIS — Z01818 Encounter for other preprocedural examination: Secondary | ICD-10-CM

## 2015-08-29 DIAGNOSIS — Z01812 Encounter for preprocedural laboratory examination: Secondary | ICD-10-CM | POA: Diagnosis not present

## 2015-08-29 DIAGNOSIS — Z01810 Encounter for preprocedural cardiovascular examination: Secondary | ICD-10-CM | POA: Diagnosis not present

## 2015-08-29 HISTORY — DX: Cough: R05

## 2015-08-29 HISTORY — DX: Reserved for inherently not codable concepts without codable children: IMO0001

## 2015-08-29 HISTORY — DX: Fracture of one rib, unspecified side, initial encounter for closed fracture: S22.39XA

## 2015-08-29 HISTORY — DX: Cough, unspecified: R05.9

## 2015-08-29 HISTORY — DX: Rash and other nonspecific skin eruption: R21

## 2015-08-29 HISTORY — DX: Cellulitis, unspecified: L03.90

## 2015-08-29 LAB — CBC WITH DIFFERENTIAL/PLATELET
BASOS PCT: 1 %
Basophils Absolute: 0 10*3/uL (ref 0.0–0.1)
Eosinophils Absolute: 0.1 10*3/uL (ref 0.0–0.7)
Eosinophils Relative: 2 %
HEMATOCRIT: 41.8 % (ref 39.0–52.0)
HEMOGLOBIN: 14.1 g/dL (ref 13.0–17.0)
LYMPHS ABS: 2.3 10*3/uL (ref 0.7–4.0)
Lymphocytes Relative: 37 %
MCH: 32.7 pg (ref 26.0–34.0)
MCHC: 33.7 g/dL (ref 30.0–36.0)
MCV: 97 fL (ref 78.0–100.0)
MONOS PCT: 10 %
Monocytes Absolute: 0.6 10*3/uL (ref 0.1–1.0)
Neutro Abs: 3.1 10*3/uL (ref 1.7–7.7)
Neutrophils Relative %: 50 %
Platelets: 262 10*3/uL (ref 150–400)
RBC: 4.31 MIL/uL (ref 4.22–5.81)
RDW: 12.9 % (ref 11.5–15.5)
WBC: 6.2 10*3/uL (ref 4.0–10.5)

## 2015-08-29 LAB — PROTIME-INR
INR: 1.08 (ref 0.00–1.49)
Prothrombin Time: 14.2 seconds (ref 11.6–15.2)

## 2015-08-29 LAB — ABO/RH: ABO/RH(D): A POS

## 2015-08-29 LAB — COMPREHENSIVE METABOLIC PANEL
ALBUMIN: 3.7 g/dL (ref 3.5–5.0)
ALK PHOS: 85 U/L (ref 38–126)
ALT: 14 U/L — AB (ref 17–63)
ANION GAP: 6 (ref 5–15)
AST: 18 U/L (ref 15–41)
BILIRUBIN TOTAL: 0.5 mg/dL (ref 0.3–1.2)
BUN: 6 mg/dL (ref 6–20)
CALCIUM: 9.2 mg/dL (ref 8.9–10.3)
CO2: 30 mmol/L (ref 22–32)
CREATININE: 0.89 mg/dL (ref 0.61–1.24)
Chloride: 105 mmol/L (ref 101–111)
GFR calc Af Amer: >60 mL/min (ref >60)
GFR calc non Af Amer: >60 mL/min (ref >60)
GLUCOSE: 75 mg/dL (ref 65–99)
Potassium: 4 mmol/L (ref 3.5–5.1)
SODIUM: 141 mmol/L (ref 135–145)
TOTAL PROTEIN: 7 g/dL (ref 6.5–8.1)

## 2015-08-29 LAB — TYPE AND SCREEN
ABO/RH(D): A POS
Antibody Screen: NEGATIVE

## 2015-08-29 LAB — SURGICAL PCR SCREEN
MRSA, PCR: NEGATIVE
STAPHYLOCOCCUS AUREUS: POSITIVE — AB

## 2015-08-29 LAB — APTT: aPTT: 29 seconds (ref 24–37)

## 2015-08-29 NOTE — Pre-Procedure Instructions (Signed)
    Lee Fischer  08/29/2015      [facility] [place] - [id number] [place] [id number] Phone: [phone] Fax: [phone]    Your procedure is scheduled on Thursday June 15th  [facility] Admitting at 5:30 am  Call this number if you have problems the morning of surgery:  [phone]   Remember:  Do not eat food or drink liquids after midnight.  Take these medicines the morning of surgery with A SIP OF WATER, none Do not take any aspirin or aspirin containing products, nsaid pain relievers, vitamins or herbal supplements   Do not wear jewelry, make-up or nail polish.  Do not wear lotions, powders, or cologne.  You may not wear deodorant.             Men may shave face and neck.  Do not bring valuables to the hospital.  [facility] is not responsible for any belongings or valuables.  Contacts, dentures or bridgework may not be worn into surgery.  Leave your suitcase in the car.  After surgery it may be brought to your room.  Special instructions:  Shower with CHG the night before and morning of surgery as instructed  Please read over the following fact sheets that you were given. Coughing and Deep Breathing, Blood Transfusion Information, MRSA Information and Surgical Site Infection Prevention, shower instructions

## 2015-08-29 NOTE — Progress Notes (Signed)
Pt does not have a primary doctor or a cardiologist.  Denies any cardiac history or any cardiac testing.

## 2015-08-30 MED ORDER — VANCOMYCIN HCL IN DEXTROSE 1-5 GM/200ML-% IV SOLN
1000.0000 mg | INTRAVENOUS | Status: AC
  Administered 2015-08-31: 1000 mg via INTRAVENOUS
  Filled 2015-08-30: qty 200
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
## 2015-08-30 NOTE — Anesthesia Preprocedure Evaluation (Addendum)
Anesthesia Evaluation  Patient identified by MRN, date of birth, ID band Patient awake    Reviewed: Allergy & Precautions, NPO status , Patient's Chart, lab work & pertinent test results  Airway Mallampati: II  TM Distance: >3 FB Neck ROM: Full    Dental  (+) Dental Advisory Given   Pulmonary Current Smoker,    breath sounds clear to auscultation       Cardiovascular negative cardio ROS   Rhythm:Regular Rate:Normal     Neuro/Psych  Headaches,    GI/Hepatic negative GI ROS, Neg liver ROS,   Endo/Other  negative endocrine ROS  Renal/GU negative Renal ROS     Musculoskeletal   Abdominal   Peds  Hematology negative hematology ROS (+)   Anesthesia Other Findings   Reproductive/Obstetrics                            Lab Results  Component Value Date   WBC 6.2 08/29/2015   HGB 14.1 08/29/2015   HCT 41.8 08/29/2015   MCV 97.0 08/29/2015   PLT 262 08/29/2015   Lab Results  Component Value Date   CREATININE 0.89 08/29/2015   BUN 6 08/29/2015   NA 141 08/29/2015   K 4.0 08/29/2015   CL 105 08/29/2015   CO2 30 08/29/2015    Anesthesia Physical Anesthesia Plan  ASA: II  Anesthesia Plan: General   Post-op Pain Management:    Induction: Intravenous  Airway Management Planned: Oral ETT  Additional Equipment:   Intra-op Plan:   Post-operative Plan: Extubation in OR  Informed Consent: I have reviewed the patients History and Physical, chart, labs and discussed the procedure including the risks, benefits and alternatives for the proposed anesthesia with the patient or authorized representative who has indicated his/her understanding and acceptance.   Dental advisory given  Plan Discussed with: CRNA  Anesthesia Plan Comments:        Anesthesia Quick Evaluation

## 2015-08-31 ENCOUNTER — Ambulatory Visit (HOSPITAL_COMMUNITY): Payer: BLUE CROSS/BLUE SHIELD

## 2015-08-31 ENCOUNTER — Encounter (HOSPITAL_COMMUNITY)
Admission: RE | Disposition: A | Discharge status: Home / Self Care | Payer: Self-pay | Source: Ambulatory Visit | Attending: Orthopedic Surgery

## 2015-08-31 ENCOUNTER — Ambulatory Visit (HOSPITAL_COMMUNITY): Payer: BLUE CROSS/BLUE SHIELD | Admitting: Anesthesiology

## 2015-08-31 ENCOUNTER — Encounter (HOSPITAL_COMMUNITY): Payer: Self-pay | Admitting: *Deleted

## 2015-08-31 ENCOUNTER — Observation Stay (HOSPITAL_COMMUNITY)
Admission: RE | Admit: 2015-08-31 | Discharge: 2015-09-01 | Disposition: A | Discharge status: Home / Self Care | Payer: BLUE CROSS/BLUE SHIELD | Source: Ambulatory Visit | Attending: Orthopedic Surgery | Admitting: Orthopedic Surgery

## 2015-08-31 ENCOUNTER — Ambulatory Visit (HOSPITAL_COMMUNITY): Payer: BLUE CROSS/BLUE SHIELD | Admitting: Vascular Surgery

## 2015-08-31 DIAGNOSIS — Z419 Encounter for procedure for purposes other than remedying health state, unspecified: Secondary | ICD-10-CM

## 2015-08-31 DIAGNOSIS — G9589 Other specified diseases of spinal cord: Secondary | ICD-10-CM | POA: Diagnosis not present

## 2015-08-31 DIAGNOSIS — S13161A Dislocation of C5/C6 cervical vertebrae, initial encounter: Principal | ICD-10-CM | POA: Insufficient documentation

## 2015-08-31 DIAGNOSIS — F17210 Nicotine dependence, cigarettes, uncomplicated: Secondary | ICD-10-CM | POA: Diagnosis not present

## 2015-08-31 DIAGNOSIS — M4802 Spinal stenosis, cervical region: Secondary | ICD-10-CM | POA: Insufficient documentation

## 2015-08-31 DIAGNOSIS — G9520 Unspecified cord compression: Secondary | ICD-10-CM | POA: Diagnosis present

## 2015-08-31 DIAGNOSIS — M5412 Radiculopathy, cervical region: Secondary | ICD-10-CM | POA: Insufficient documentation

## 2015-08-31 DIAGNOSIS — G549 Nerve root and plexus disorder, unspecified: Secondary | ICD-10-CM | POA: Diagnosis present

## 2015-08-31 DIAGNOSIS — Z01818 Encounter for other preprocedural examination: Secondary | ICD-10-CM

## 2015-08-31 HISTORY — PX: ANTERIOR CERVICAL DECOMP/DISCECTOMY FUSION: SHX1161

## 2015-08-31 LAB — GLUCOSE, CAPILLARY: GLUCOSE-CAPILLARY: 101 mg/dL — AB (ref 65–99)

## 2015-08-31 SURGERY — ANTERIOR CERVICAL DECOMPRESSION/DISCECTOMY FUSION 2 LEVELS
Anesthesia: General

## 2015-08-31 MED ORDER — MIDAZOLAM HCL 5 MG/5ML IJ SOLN
INTRAMUSCULAR | Status: DC | PRN
  Administered 2015-08-31: 2 mg via INTRAVENOUS

## 2015-08-31 MED ORDER — PHENYLEPHRINE HCL 10 MG/ML IJ SOLN
10.0000 mg | INTRAVENOUS | Status: DC | PRN
  Administered 2015-08-31: 20 ug/min via INTRAVENOUS
  Administered 2015-08-31 (×2): via INTRAVENOUS

## 2015-08-31 MED ORDER — ACETAMINOPHEN 650 MG RE SUPP
650.0000 mg | RECTAL | Status: DC | PRN
Start: 2015-08-31 — End: 2015-09-01

## 2015-08-31 MED ORDER — MORPHINE SULFATE (PF) 2 MG/ML IV SOLN
1.0000 mg | INTRAVENOUS | Status: DC | PRN
  Administered 2015-08-31 (×2): 2 mg via INTRAVENOUS
  Administered 2015-09-01: 4 mg via INTRAVENOUS
  Administered 2015-09-01: 2 mg via INTRAVENOUS
  Filled 2015-08-31 (×3): qty 1
  Filled 2015-08-31: qty 2

## 2015-08-31 MED ORDER — SODIUM CHLORIDE 0.9 % IV SOLN
250.0000 mL | INTRAVENOUS | Status: DC
Start: 2015-08-31 — End: 2015-09-01
  Administered 2015-08-31: 250 mL via INTRAVENOUS

## 2015-08-31 MED ORDER — OXYCODONE HCL 5 MG/5ML PO SOLN: 5.0000 mg | Freq: Once | ORAL | Status: DC | PRN

## 2015-08-31 MED ORDER — PROPOFOL 1000 MG/100ML IV EMUL
INTRAVENOUS | Status: AC
  Filled 2015-08-31: qty 100

## 2015-08-31 MED ORDER — POVIDONE-IODINE 7.5 % EX SOLN
Freq: Once | CUTANEOUS | Status: DC
  Filled 2015-08-31: qty 118

## 2015-08-31 MED ORDER — THROMBIN 20000 UNITS EX KIT
PACK | CUTANEOUS | Status: DC | PRN
  Administered 2015-08-31 (×2): 20000 [IU] via TOPICAL

## 2015-08-31 MED ORDER — THROMBIN 20000 UNITS EX SOLR
CUTANEOUS | Status: AC
  Filled 2015-08-31: qty 20000

## 2015-08-31 MED ORDER — GLYCOPYRROLATE 0.2 MG/ML IV SOSY
PREFILLED_SYRINGE | INTRAVENOUS | Status: AC
  Filled 2015-08-31: qty 3

## 2015-08-31 MED ORDER — FLEET ENEMA 7-19 GM/118ML RE ENEM: 1.0000 | ENEMA | Freq: Once | RECTAL | Status: DC | PRN

## 2015-08-31 MED ORDER — PROPOFOL 10 MG/ML IV BOLUS
INTRAVENOUS | Status: AC
  Filled 2015-08-31: qty 20

## 2015-08-31 MED ORDER — LACTATED RINGERS IV SOLN
INTRAVENOUS | Status: DC | PRN
  Administered 2015-08-31: 07:00:00 via INTRAVENOUS

## 2015-08-31 MED ORDER — MIDAZOLAM HCL 2 MG/2ML IJ SOLN
INTRAMUSCULAR | Status: AC
  Filled 2015-08-31: qty 2

## 2015-08-31 MED ORDER — ONDANSETRON HCL 4 MG/2ML IJ SOLN
4.0000 mg | INTRAMUSCULAR | Status: DC | PRN
Start: 2015-08-31 — End: 2015-09-01

## 2015-08-31 MED ORDER — FENTANYL CITRATE (PF) 250 MCG/5ML IJ SOLN
INTRAMUSCULAR | Status: AC
  Filled 2015-08-31: qty 5

## 2015-08-31 MED ORDER — BACITRACIN ZINC 500 UNIT/GM EX OINT
TOPICAL_OINTMENT | CUTANEOUS | Status: AC
  Filled 2015-08-31: qty 28.35

## 2015-08-31 MED ORDER — SODIUM CHLORIDE 0.9% FLUSH: 3.0000 mL | INTRAVENOUS | Status: DC | PRN

## 2015-08-31 MED ORDER — PROMETHAZINE HCL 25 MG/ML IJ SOLN: 6.2500 mg | INTRAMUSCULAR | Status: DC | PRN

## 2015-08-31 MED ORDER — PHENYLEPHRINE HCL 10 MG/ML IJ SOLN
INTRAMUSCULAR | Status: DC | PRN
  Administered 2015-08-31: 80 ug via INTRAVENOUS
  Administered 2015-08-31: 120 ug via INTRAVENOUS
  Administered 2015-08-31: 80 ug via INTRAVENOUS

## 2015-08-31 MED ORDER — BUPIVACAINE-EPINEPHRINE (PF) 0.25% -1:200000 IJ SOLN
INTRAMUSCULAR | Status: AC
  Filled 2015-08-31: qty 30

## 2015-08-31 MED ORDER — GLYCOPYRROLATE 0.2 MG/ML IJ SOLN
INTRAMUSCULAR | Status: DC | PRN
  Administered 2015-08-31: 0.2 mg via INTRAVENOUS
  Administered 2015-08-31: .2 mg via INTRAVENOUS
  Administered 2015-08-31: 0.2 mg via INTRAVENOUS
  Administered 2015-08-31: .2 mg via INTRAVENOUS

## 2015-08-31 MED ORDER — SODIUM CHLORIDE 0.9 % IV SOLN
0.1500 ug/kg/min | INTRAVENOUS | Status: DC
  Filled 2015-08-31: qty 1000

## 2015-08-31 MED ORDER — DIAZEPAM 5 MG PO TABS
5.0000 mg | ORAL_TABLET | Freq: Four times a day (QID) | ORAL | Status: DC | PRN
  Administered 2015-09-01 (×2): 5 mg via ORAL
  Filled 2015-08-31 (×2): qty 1

## 2015-08-31 MED ORDER — PROPOFOL 10 MG/ML IV BOLUS
INTRAVENOUS | Status: DC | PRN
  Administered 2015-08-31: 30 mg via INTRAVENOUS
  Administered 2015-08-31: 20 mg via INTRAVENOUS
  Administered 2015-08-31: 150 mg via INTRAVENOUS

## 2015-08-31 MED ORDER — ACETAMINOPHEN 325 MG PO TABS: 650.0000 mg | ORAL_TABLET | ORAL | Status: DC | PRN

## 2015-08-31 MED ORDER — PROPOFOL 500 MG/50ML IV EMUL
INTRAVENOUS | Status: DC | PRN
  Administered 2015-08-31: 50 ug/kg/min via INTRAVENOUS
  Administered 2015-08-31: 13:00:00 via INTRAVENOUS

## 2015-08-31 MED ORDER — 0.9 % SODIUM CHLORIDE (POUR BTL) OPTIME
TOPICAL | Status: DC | PRN
  Administered 2015-08-31 (×3): 1000 mL

## 2015-08-31 MED ORDER — BUPIVACAINE-EPINEPHRINE 0.25% -1:200000 IJ SOLN
INTRAMUSCULAR | Status: DC | PRN
  Administered 2015-08-31: 30 mL

## 2015-08-31 MED ORDER — VANCOMYCIN HCL IN DEXTROSE 1-5 GM/200ML-% IV SOLN
1000.0000 mg | Freq: Once | INTRAVENOUS | Status: AC
  Administered 2015-08-31: 1000 mg via INTRAVENOUS
  Filled 2015-08-31: qty 200

## 2015-08-31 MED ORDER — HYDROMORPHONE HCL 1 MG/ML IJ SOLN
0.2500 mg | INTRAMUSCULAR | Status: DC | PRN
  Administered 2015-08-31: 0.5 mg via INTRAVENOUS

## 2015-08-31 MED ORDER — ZOLPIDEM TARTRATE 5 MG PO TABS: 5.0000 mg | ORAL_TABLET | Freq: Every evening | ORAL | Status: DC | PRN

## 2015-08-31 MED ORDER — HYDROMORPHONE HCL 1 MG/ML IJ SOLN
INTRAMUSCULAR | Status: AC
  Filled 2015-08-31: qty 1

## 2015-08-31 MED ORDER — LIDOCAINE HCL (CARDIAC) 20 MG/ML IV SOLN
INTRAVENOUS | Status: DC | PRN
  Administered 2015-08-31: 60 mg via INTRAVENOUS

## 2015-08-31 MED ORDER — ALUM & MAG HYDROXIDE-SIMETH 200-200-20 MG/5ML PO SUSP: 30.0000 mL | Freq: Four times a day (QID) | ORAL | Status: DC | PRN

## 2015-08-31 MED ORDER — HEMOSTATIC AGENTS (NO CHARGE) OPTIME
TOPICAL | Status: DC | PRN
  Administered 2015-08-31: 1 via TOPICAL

## 2015-08-31 MED ORDER — ROCURONIUM BROMIDE 100 MG/10ML IV SOLN
INTRAVENOUS | Status: DC | PRN
  Administered 2015-08-31: 20 mg via INTRAVENOUS

## 2015-08-31 MED ORDER — SUCCINYLCHOLINE CHLORIDE 20 MG/ML IJ SOLN
INTRAMUSCULAR | Status: DC | PRN
  Administered 2015-08-31: 100 mg via INTRAVENOUS

## 2015-08-31 MED ORDER — MENTHOL 3 MG MT LOZG: 1.0000 | LOZENGE | OROMUCOSAL | Status: DC | PRN

## 2015-08-31 MED ORDER — SENNOSIDES-DOCUSATE SODIUM 8.6-50 MG PO TABS: 1.0000 | ORAL_TABLET | Freq: Every evening | ORAL | Status: DC | PRN

## 2015-08-31 MED ORDER — SODIUM CHLORIDE 0.9 % IV SOLN
0.1500 ug/kg/min | INTRAVENOUS | Status: AC
  Administered 2015-08-31: 15:00:00 via INTRAVENOUS
  Administered 2015-08-31: .1 ug/kg/min via INTRAVENOUS
  Filled 2015-08-31: qty 2000

## 2015-08-31 MED ORDER — DOCUSATE SODIUM 100 MG PO CAPS
100.0000 mg | ORAL_CAPSULE | Freq: Two times a day (BID) | ORAL | Status: DC
  Administered 2015-09-01: 100 mg via ORAL
  Filled 2015-08-31 (×2): qty 1

## 2015-08-31 MED ORDER — LIDOCAINE 2% (20 MG/ML) 5 ML SYRINGE
INTRAMUSCULAR | Status: AC
  Filled 2015-08-31: qty 5

## 2015-08-31 MED ORDER — ROCURONIUM BROMIDE 50 MG/5ML IV SOLN
INTRAVENOUS | Status: AC
  Filled 2015-08-31: qty 1

## 2015-08-31 MED ORDER — ONDANSETRON HCL 4 MG/2ML IJ SOLN
INTRAMUSCULAR | Status: DC | PRN
Start: 2015-08-31 — End: 2015-08-31
  Administered 2015-08-31: 4 mg via INTRAVENOUS

## 2015-08-31 MED ORDER — BISACODYL 5 MG PO TBEC: 5.0000 mg | DELAYED_RELEASE_TABLET | Freq: Every day | ORAL | Status: DC | PRN

## 2015-08-31 MED ORDER — FENTANYL CITRATE (PF) 100 MCG/2ML IJ SOLN
INTRAMUSCULAR | Status: DC | PRN
  Administered 2015-08-31: 50 ug via INTRAVENOUS
  Administered 2015-08-31: 100 ug via INTRAVENOUS
  Administered 2015-08-31 (×2): 50 ug via INTRAVENOUS

## 2015-08-31 MED ORDER — LACTATED RINGERS IV SOLN
INTRAVENOUS | Status: DC | PRN
  Administered 2015-08-31 (×2): via INTRAVENOUS

## 2015-08-31 MED ORDER — OXYCODONE HCL 5 MG PO TABS
5.0000 mg | ORAL_TABLET | Freq: Once | ORAL | Status: DC | PRN
Start: 2015-08-31 — End: 2015-08-31

## 2015-08-31 MED ORDER — PHENOL 1.4 % MT LIQD
1.0000 | OROMUCOSAL | Status: DC | PRN
Start: 2015-08-31 — End: 2015-09-01

## 2015-08-31 MED ORDER — SODIUM CHLORIDE 0.9% FLUSH
3.0000 mL | Freq: Two times a day (BID) | INTRAVENOUS | Status: DC
  Administered 2015-08-31: 3 mL via INTRAVENOUS

## 2015-08-31 MED ORDER — HYDROCODONE-ACETAMINOPHEN 5-325 MG PO TABS
1.0000 | ORAL_TABLET | ORAL | Status: DC | PRN
  Administered 2015-09-01: 2 via ORAL
  Filled 2015-08-31: qty 2

## 2015-08-31 MED ORDER — OXYCODONE-ACETAMINOPHEN 5-325 MG PO TABS: 1.0000 | ORAL_TABLET | ORAL | Status: DC | PRN

## 2015-08-31 MED FILL — Sodium Chloride IV Soln 0.9%: INTRAVENOUS | Qty: 1000 | Status: AC

## 2015-08-31 MED FILL — Heparin Sodium (Porcine) Inj 1000 Unit/ML: INTRAMUSCULAR | Qty: 30 | Status: AC

## 2015-08-31 SURGICAL SUPPLY — 102 items
BENZOIN TINCTURE PRP APPL 2/3 (GAUZE/BANDAGES/DRESSINGS) ×6 IMPLANT
BIT DRILL MOUNTAINER FXED 12MM (DRILL) ×1 IMPLANT
BIT DRILL NEURO 2X3.1 SFT TUCH (MISCELLANEOUS) ×1 IMPLANT
BIT DRILL SKYLINE 12MM (BIT) ×1 IMPLANT
BIT DRILL SRG 14X2.2XFLT CHK (BIT) ×1 IMPLANT
BIT DRL SRG 14X2.2XFLT CHK (BIT) ×1
BLADE SURG 10 STRL SS (BLADE) ×3 IMPLANT
BLADE SURG 15 STRL LF DISP TIS (BLADE) ×2 IMPLANT
BLADE SURG 15 STRL SS (BLADE) ×4
BLADE SURG ROTATE 9660 (MISCELLANEOUS) ×3 IMPLANT
BUR MATCHSTICK NEURO 3.0 LAGG (BURR) ×3 IMPLANT
BUR PRESCISION 1.7 ELITE (BURR) ×3 IMPLANT
CARTRIDGE OIL MAESTRO DRILL (MISCELLANEOUS) ×2 IMPLANT
CASSETTE HEAD DISP 12 (Head) ×3 IMPLANT
CLOSURE STERI-STRIP 1/2X4 (GAUZE/BANDAGES/DRESSINGS) ×2
CLOSURE WOUND 1/2 X4 (GAUZE/BANDAGES/DRESSINGS) ×1
CLSR STERI-STRIP ANTIMIC 1/2X4 (GAUZE/BANDAGES/DRESSINGS) ×4 IMPLANT
COLLAR CERV LO CONTOUR FIRM DE (SOFTGOODS) IMPLANT
CORDS BIPOLAR (ELECTRODE) ×6 IMPLANT
COVER SURGICAL LIGHT HANDLE (MISCELLANEOUS) ×9 IMPLANT
CRADLE DONUT ADULT HEAD (MISCELLANEOUS) ×3 IMPLANT
DECANTER SPIKE VIAL GLASS SM (MISCELLANEOUS) ×3 IMPLANT
DIFFUSER DRILL AIR PNEUMATIC (MISCELLANEOUS) ×6 IMPLANT
DRAIN JACKSON RD 7FR 3/32 (WOUND CARE) IMPLANT
DRAPE C-ARM 42X72 X-RAY (DRAPES) ×6 IMPLANT
DRAPE POUCH INSTRU U-SHP 10X18 (DRAPES) ×6 IMPLANT
DRAPE SURG 17X23 STRL (DRAPES) ×21 IMPLANT
DRILL BIT SKYLINE 12MM (BIT) ×2
DRILL BIT SKYLINE 14MM (BIT) ×2
DRILL MOUNTAINEER FIXED 12MM (DRILL) ×3
DRILL NEURO 2X3.1 SOFT TOUCH (MISCELLANEOUS) ×3
DURAPREP 26ML APPLICATOR (WOUND CARE) ×6 IMPLANT
ELECT CAUTERY BLADE 6.4 (BLADE) ×3 IMPLANT
ELECT COATED BLADE 2.86 ST (ELECTRODE) ×3 IMPLANT
ELECT REM PT RETURN 9FT ADLT (ELECTROSURGICAL) ×3
ELECTRODE REM PT RTRN 9FT ADLT (ELECTROSURGICAL) ×1 IMPLANT
EVACUATOR SILICONE 100CC (DRAIN) IMPLANT
FEE INTRAOP MONITOR IMPULS NCS (MISCELLANEOUS) ×1 IMPLANT
GAUZE SPONGE 4X4 12PLY STRL (GAUZE/BANDAGES/DRESSINGS) ×3 IMPLANT
GAUZE SPONGE 4X4 16PLY XRAY LF (GAUZE/BANDAGES/DRESSINGS) ×6 IMPLANT
GLOVE BIO SURGEON STRL SZ7 (GLOVE) ×6 IMPLANT
GLOVE BIO SURGEON STRL SZ8 (GLOVE) ×3 IMPLANT
GLOVE BIOGEL PI IND STRL 7.5 (GLOVE) ×2 IMPLANT
GLOVE BIOGEL PI IND STRL 8 (GLOVE) ×2 IMPLANT
GLOVE BIOGEL PI INDICATOR 7.5 (GLOVE) ×4
GLOVE BIOGEL PI INDICATOR 8 (GLOVE) ×4
GOWN STRL REUS W/ TWL LRG LVL3 (GOWN DISPOSABLE) ×1 IMPLANT
GOWN STRL REUS W/ TWL XL LVL3 (GOWN DISPOSABLE) ×1 IMPLANT
GOWN STRL REUS W/TWL LRG LVL3 (GOWN DISPOSABLE) ×2
GOWN STRL REUS W/TWL XL LVL3 (GOWN DISPOSABLE) ×2
INTRAOP MONITOR FEE IMPULS NCS (MISCELLANEOUS) ×1
INTRAOP MONITOR FEE IMPULSE (MISCELLANEOUS) ×2
IV CATH 14GX2 1/4 (CATHETERS) ×3 IMPLANT
KIT BASIN OR (CUSTOM PROCEDURE TRAY) ×3 IMPLANT
KIT ROOM TURNOVER OR (KITS) ×3 IMPLANT
MANIFOLD NEPTUNE II (INSTRUMENTS) ×3 IMPLANT
MARKER SKIN DUAL TIP RULER LAB (MISCELLANEOUS) ×6 IMPLANT
NEEDLE 27GAX1X1/2 (NEEDLE) ×3 IMPLANT
NEEDLE SPNL 20GX3.5 QUINCKE YW (NEEDLE) ×3 IMPLANT
NS IRRIG 1000ML POUR BTL (IV SOLUTION) ×9 IMPLANT
OIL CARTRIDGE MAESTRO DRILL (MISCELLANEOUS) ×6
PACK ORTHO CERVICAL (CUSTOM PROCEDURE TRAY) ×3 IMPLANT
PAD ARMBOARD 7.5X6 YLW CONV (MISCELLANEOUS) ×6 IMPLANT
PATTIES SURGICAL .5 X.5 (GAUZE/BANDAGES/DRESSINGS) ×6 IMPLANT
PATTIES SURGICAL .5 X1 (DISPOSABLE) ×6 IMPLANT
PENCIL BUTTON HOLSTER BLD 10FT (ELECTRODE) ×3 IMPLANT
PIN DISTRACTION 14 (PIN) ×9 IMPLANT
PLATE SKYLINE 3LVL 42MM (Plate) ×3 IMPLANT
PUTTY BONE DBX 5CC MIX (Putty) ×3 IMPLANT
PUTTY DBX 1CC (Putty) ×6 IMPLANT
PUTTY DBX 1CC DEPUY (Putty) ×2 IMPLANT
ROD MOUNTAINEER 120MM (Rod) ×3 IMPLANT
SCREW F A 3.5X12 (Screw) ×12 IMPLANT
SCREW INNER (Screw) ×18 IMPLANT
SCREW LOCKING VBR OMNI (Screw) ×3 IMPLANT
SCREW MTN POLY 4.0X22MM (Screw) ×6 IMPLANT
SCREW SKYLINE VAR OS 14MM (Screw) ×9 IMPLANT
SCREW SKYLINE VARIABLE LG (Screw) ×6 IMPLANT
SCREW VAR SELF TAP SKYLINE 14M (Screw) ×3 IMPLANT
SPACER SM YBR 12MMX16-25X6 (Spacer) ×3 IMPLANT
SPONGE GAUZE 4X4 12PLY STER LF (GAUZE/BANDAGES/DRESSINGS) ×6 IMPLANT
SPONGE INTESTINAL PEANUT (DISPOSABLE) ×6 IMPLANT
SPONGE LAP 4X18 X RAY DECT (DISPOSABLE) ×3 IMPLANT
SPONGE SURGIFOAM ABS GEL 100 (HEMOSTASIS) ×3 IMPLANT
STRIP CLOSURE SKIN 1/2X4 (GAUZE/BANDAGES/DRESSINGS) ×2 IMPLANT
SURGIFLO W/THROMBIN 8M KIT (HEMOSTASIS) ×3 IMPLANT
SUT BONE WAX W31G (SUTURE) ×3 IMPLANT
SUT MNCRL AB 4-0 PS2 18 (SUTURE) ×6 IMPLANT
SUT SILK 4 0 (SUTURE) ×2
SUT SILK 4-0 18XBRD TIE 12 (SUTURE) ×1 IMPLANT
SUT VIC AB 1 CT1 18XCR BRD 8 (SUTURE) ×2 IMPLANT
SUT VIC AB 1 CT1 8-18 (SUTURE) ×4
SUT VIC AB 2-0 CT2 18 VCP726D (SUTURE) ×6 IMPLANT
SYR BULB IRRIGATION 50ML (SYRINGE) ×3 IMPLANT
SYR CONTROL 10ML LL (SYRINGE) ×6 IMPLANT
TAPE CLOTH 4X10 WHT NS (GAUZE/BANDAGES/DRESSINGS) IMPLANT
TAPE CLOTH SURG 4X10 WHT LF (GAUZE/BANDAGES/DRESSINGS) ×6 IMPLANT
TAPE UMBILICAL COTTON 1/8X30 (MISCELLANEOUS) ×3 IMPLANT
TOWEL OR 17X24 6PK STRL BLUE (TOWEL DISPOSABLE) ×3 IMPLANT
TOWEL OR 17X26 10 PK STRL BLUE (TOWEL DISPOSABLE) ×3 IMPLANT
WATER STERILE IRR 1000ML POUR (IV SOLUTION) ×3 IMPLANT
YANKAUER SUCT BULB TIP NO VENT (SUCTIONS) ×6 IMPLANT

## 2015-08-31 NOTE — Anesthesia Postprocedure Evaluation (Signed)
Anesthesia Post Note  Patient: Lee Fischer  Procedure(s) Performed: Procedure(s) (LRB): ANTERIOR CERVICAL DECOMPRESSION FUSION, CERVICAL 5-6, CERVICAL 6-7 WITH CERVICAL 4-5, 5-, 6-7 INSTRUMENTATION AND ALLOGRAFT; CERVICAL 6 CORPECTOMY, POSTERIOR SPINAL FUSION, CERVICAL 4-5, 5-6, 6-7 WITH INSTRUMENTATION, ALLOGRAFT. (N/A)  Patient location during evaluation: PACU Anesthesia Type: General Level of consciousness: awake and alert Pain management: pain level controlled Vital Signs Assessment: post-procedure vital signs reviewed and stable Respiratory status: spontaneous breathing, nonlabored ventilation, respiratory function stable and patient connected to nasal cannula oxygen Cardiovascular status: blood pressure returned to baseline and stable Postop Assessment: no signs of nausea or vomiting Anesthetic complications: no    Last Vitals:  Filed Vitals:   08/31/15 1600 08/31/15 1605  BP:  127/77  Pulse: 93 86  Temp:    Resp: 18 15    Last Pain:  Filed Vitals:   08/31/15 1612  PainSc: 10-Worst pain ever                 Reino KentJudd, Haelie Clapp J

## 2015-08-31 NOTE — Transfer of Care (Signed)
Immediate Anesthesia Transfer of Care Note  Patient: Lee Fischer  Procedure(s) Performed: Procedure(s) with comments: ANTERIOR CERVICAL DECOMPRESSION FUSION, CERVICAL 5-6, CERVICAL 6-7 WITH CERVICAL 4-5, 5-, 6-7 INSTRUMENTATION AND ALLOGRAFT; CERVICAL 6 CORPECTOMY, POSTERIOR SPINAL FUSION, CERVICAL 4-5, 5-6, 6-7 WITH INSTRUMENTATION, ALLOGRAFT. (N/A) - ANTERIOR CERVICAL DECOMPRESSION FUSION, CERVICAL 5-6, CERVICAL 6-7 WITH CERVICAL 4-5, 5-, 6-7 INSTRUMENTATION AND ALLOGRAFT; CERVICAL 6 CORPECTOMY, POSTERIOR SPINAL FUSION, CERVICAL 4-5, 5-6, 6-7 WITH INSTRUMENTATION, ALLOGRAFT.  Patient Location: PACU  Anesthesia Type:General  Level of Consciousness: awake, patient cooperative and responds to stimulation  Airway & Oxygen Therapy: Patient Spontanous Breathing and Patient connected to nasal cannula oxygen  Post-op Assessment: Report given to RN and Post -op Vital signs reviewed and stable  Post vital signs: Reviewed and stable  Last Vitals:  Filed Vitals:   08/31/15 0635  BP: 110/72  Pulse: 61  Temp: 36.8 C  Resp: 20    Last Pain: There were no vitals filed for this visit.    Patients Stated Pain Goal: 5 (08/31/15 [id number])  Complications: No apparent anesthesia complications

## 2015-08-31 NOTE — Progress Notes (Signed)
Pt arrived on unit from PACU. RN received report at bedside. Pt still drowsy post surgery.

## 2015-08-31 NOTE — Anesthesia Procedure Notes (Addendum)
Procedure Name: Intubation Date/Time: 08/31/2015 7:44 AM Performed by: Faustino CongressWHITE, Micharl Helmes TENA Yunis Voorheis Pre-anesthesia Checklist: Patient identified, Emergency Drugs available, Suction available and Patient being monitored Patient Re-evaluated:Patient Re-evaluated prior to inductionOxygen Delivery Method: Circle System Utilized Preoxygenation: Pre-oxygenation with 100% oxygen Intubation Type: IV induction Ventilation: Mask ventilation without difficulty Laryngoscope Size: Glidescope and 3 Grade View: Grade I Tube type: Oral Tube size: 7.5 mm Number of attempts: 1 Airway Equipment and Method: Stylet and Video-laryngoscopy Placement Confirmation: ETT inserted through vocal cords under direct vision,  positive ETCO2 and breath sounds checked- equal and bilateral Secured at: 23 cm Tube secured with: Tape Dental Injury: Teeth and Oropharynx as per pre-operative assessment

## 2015-09-01 ENCOUNTER — Encounter (HOSPITAL_COMMUNITY): Payer: Self-pay | Admitting: Orthopedic Surgery

## 2015-09-01 DIAGNOSIS — S13161A Dislocation of C5/C6 cervical vertebrae, initial encounter: Secondary | ICD-10-CM | POA: Diagnosis not present

## 2015-09-01 MED ORDER — NICOTINE 21 MG/24HR TD PT24
21.0000 mg | MEDICATED_PATCH | Freq: Every day | TRANSDERMAL | Status: DC
  Administered 2015-09-01: 21 mg via TRANSDERMAL
  Filled 2015-09-01: qty 1

## 2015-09-01 NOTE — Care Management Note (Signed)
Case Management Note  Patient Details  Name: Lee Fischer MRN: [id number] Date of Birth: 03-Apr-1955  Subjective/Objective:                    Action/Plan: Pt discharging home with self care. No further needs per CM.   Expected Discharge Date:                  Expected Discharge Plan:     In-House Referral:     Discharge planning Services     Post Acute Care Choice:    Choice offered to:     DME Arranged:    DME Agency:     HH Arranged:    HH Agency:     Status of Service:     Medicare Important Message Given:    Date Medicare IM Given:    Medicare IM give by:    Date Additional Medicare IM Given:    Additional Medicare Important Message give by:     If discussed at Long Length of Stay Meetings, dates discussed:    Additional Comments:  Lee BaloKelli F Sky Primo, RN 09/01/2015, 8:40 AM
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
## 2015-09-01 NOTE — Progress Notes (Addendum)
    Patient doing well with resolved arm pain. Reports substantial discomfort with the collar, feels very claustrophobic and feels it is too tight. Reports his throat being very sore. He has not eaten. He has not yet been up to ambulate.    Physical Exam: BP 106/61 mmHg  Pulse 77  Temp(Src) 98.2 F (36.8 C) (Oral)  Resp 20  SpO2 96%  Dressing in place, CDI, hard collar worn appropriately, pt in bed sitting up appears uncomfortable and fidgeting.   NVI  POD #1 s/p A/P Cervical Fusion, resolved arm pain   - Hard collar must be maintained tightly, pt advised  -Hard collar CAN be loosened when eating but must be re-tightened after  -Throat soreness likely from anesthesia and esophageal retraction during    surgery, pt advised   -Please provide pt throat lozenges and spray for comfort - Pt needs to be walked in hallway regularly - Pt is heavy smoker, nicotine patches should be applied X2   - Percocet for pain, Valium for muscle spasms - likely d/c home today after eating and ambulation   -Pt should be d/c'd with philadelphia collar for showering, can remove outer   bandage and shower over steri-strips with philly collar after 5 days

## 2015-09-01 NOTE — Progress Notes (Signed)
Pt discharged per orders from MD. Pt and family educated on discharge instructions. Pt and family verbalized understanding of instructions. All questions and concerns were addressed. Pt's IV was removed before discharge. Pt exited hospital via wheelchair.

## 2015-09-04 ENCOUNTER — Encounter (HOSPITAL_COMMUNITY): Payer: Self-pay | Admitting: Orthopedic Surgery

## 2015-09-05 MED FILL — Thrombin For Soln 20000 Unit: CUTANEOUS | Qty: 1 | Status: AC
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
## 2018-03-07 IMAGING — CR DG CERVICAL SPINE 2 OR 3 VIEWS
3 series · 3 of 3 positions shown · non-contrast
Comparison: MRI cervical spine 07/21/2015. CT cervical spine
07/21/2015.

CLINICAL DATA: Preoperative cervical spine surgery.

EXAM:
CERVICAL SPINE - 2-3 VIEW

[w cervical spine lat]
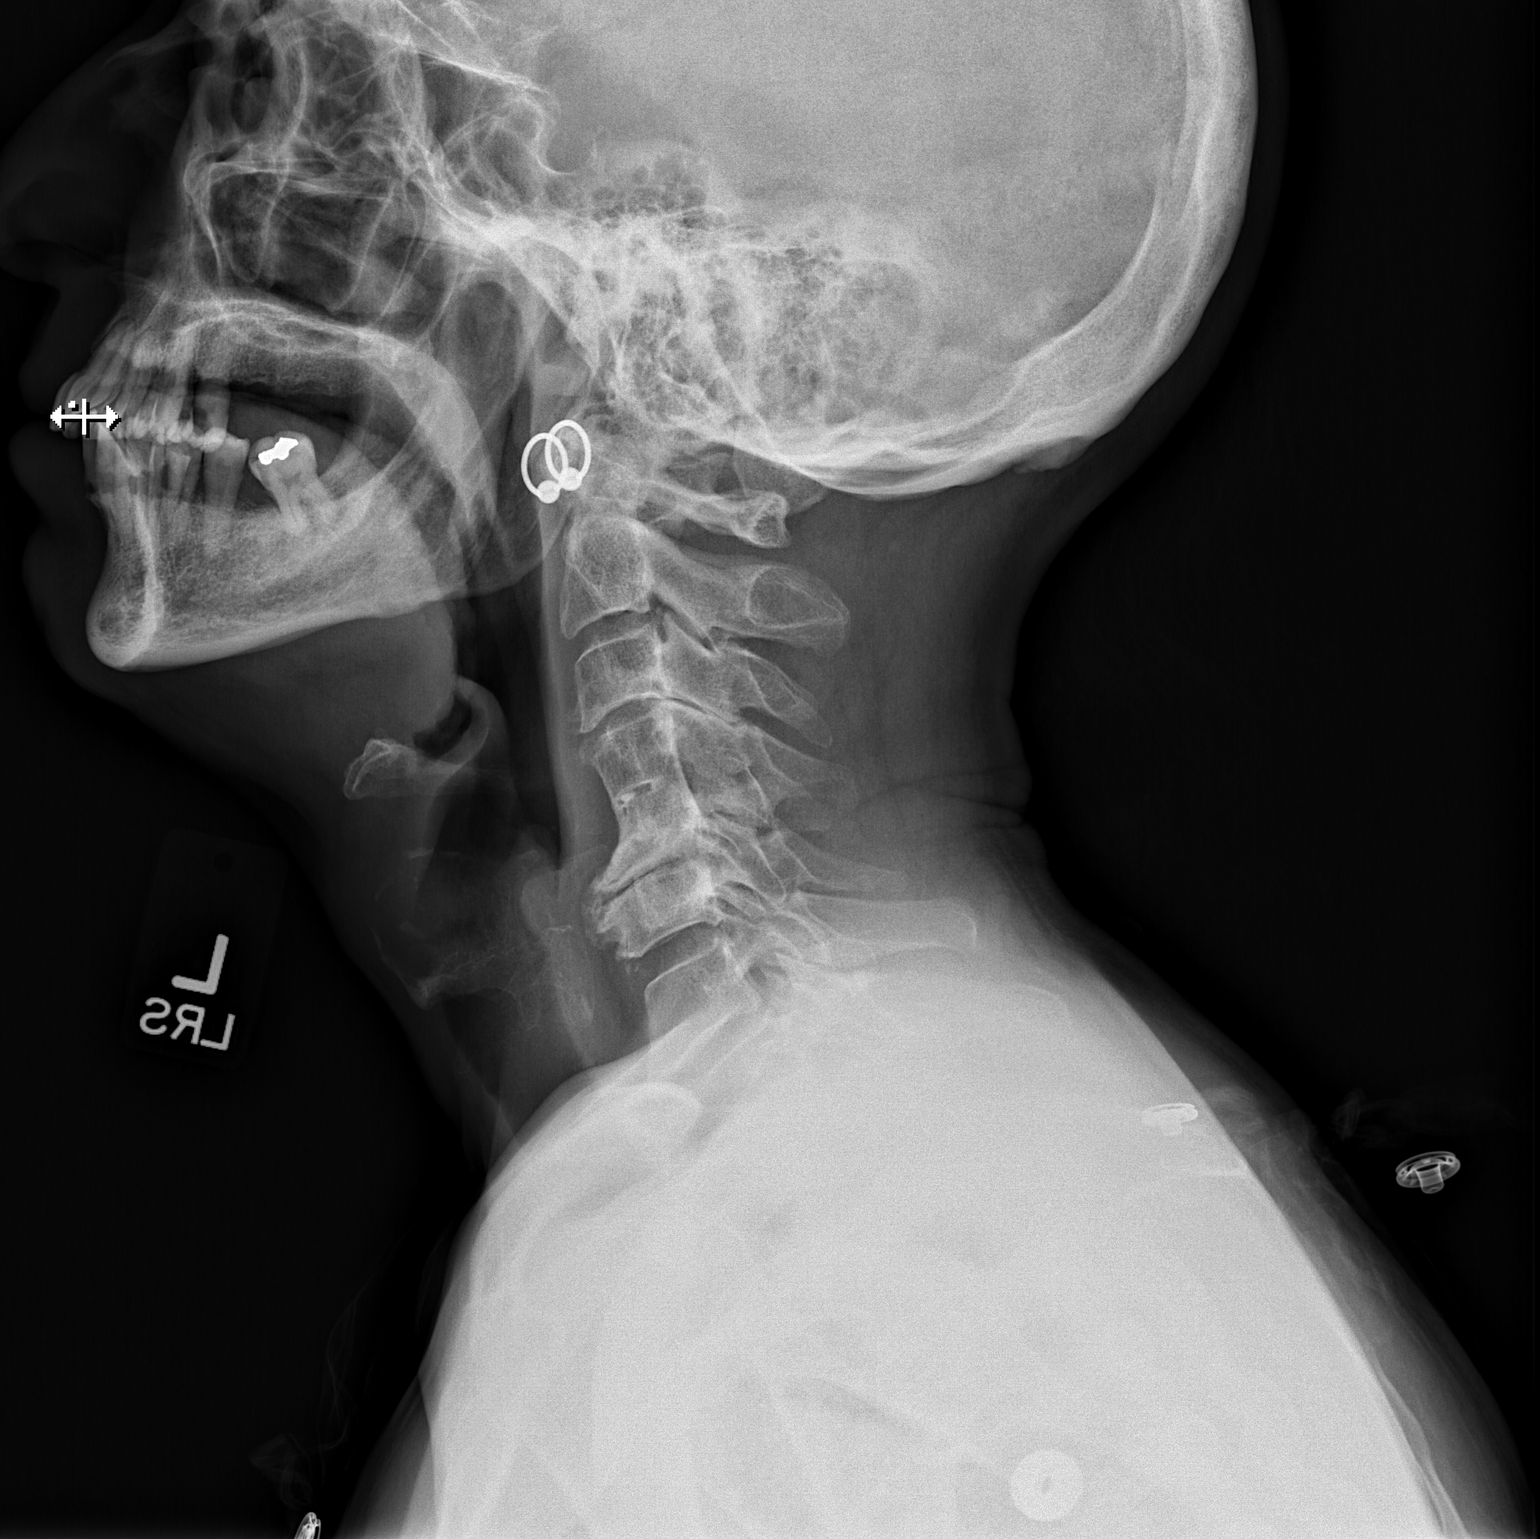

[w cervical spine ap]
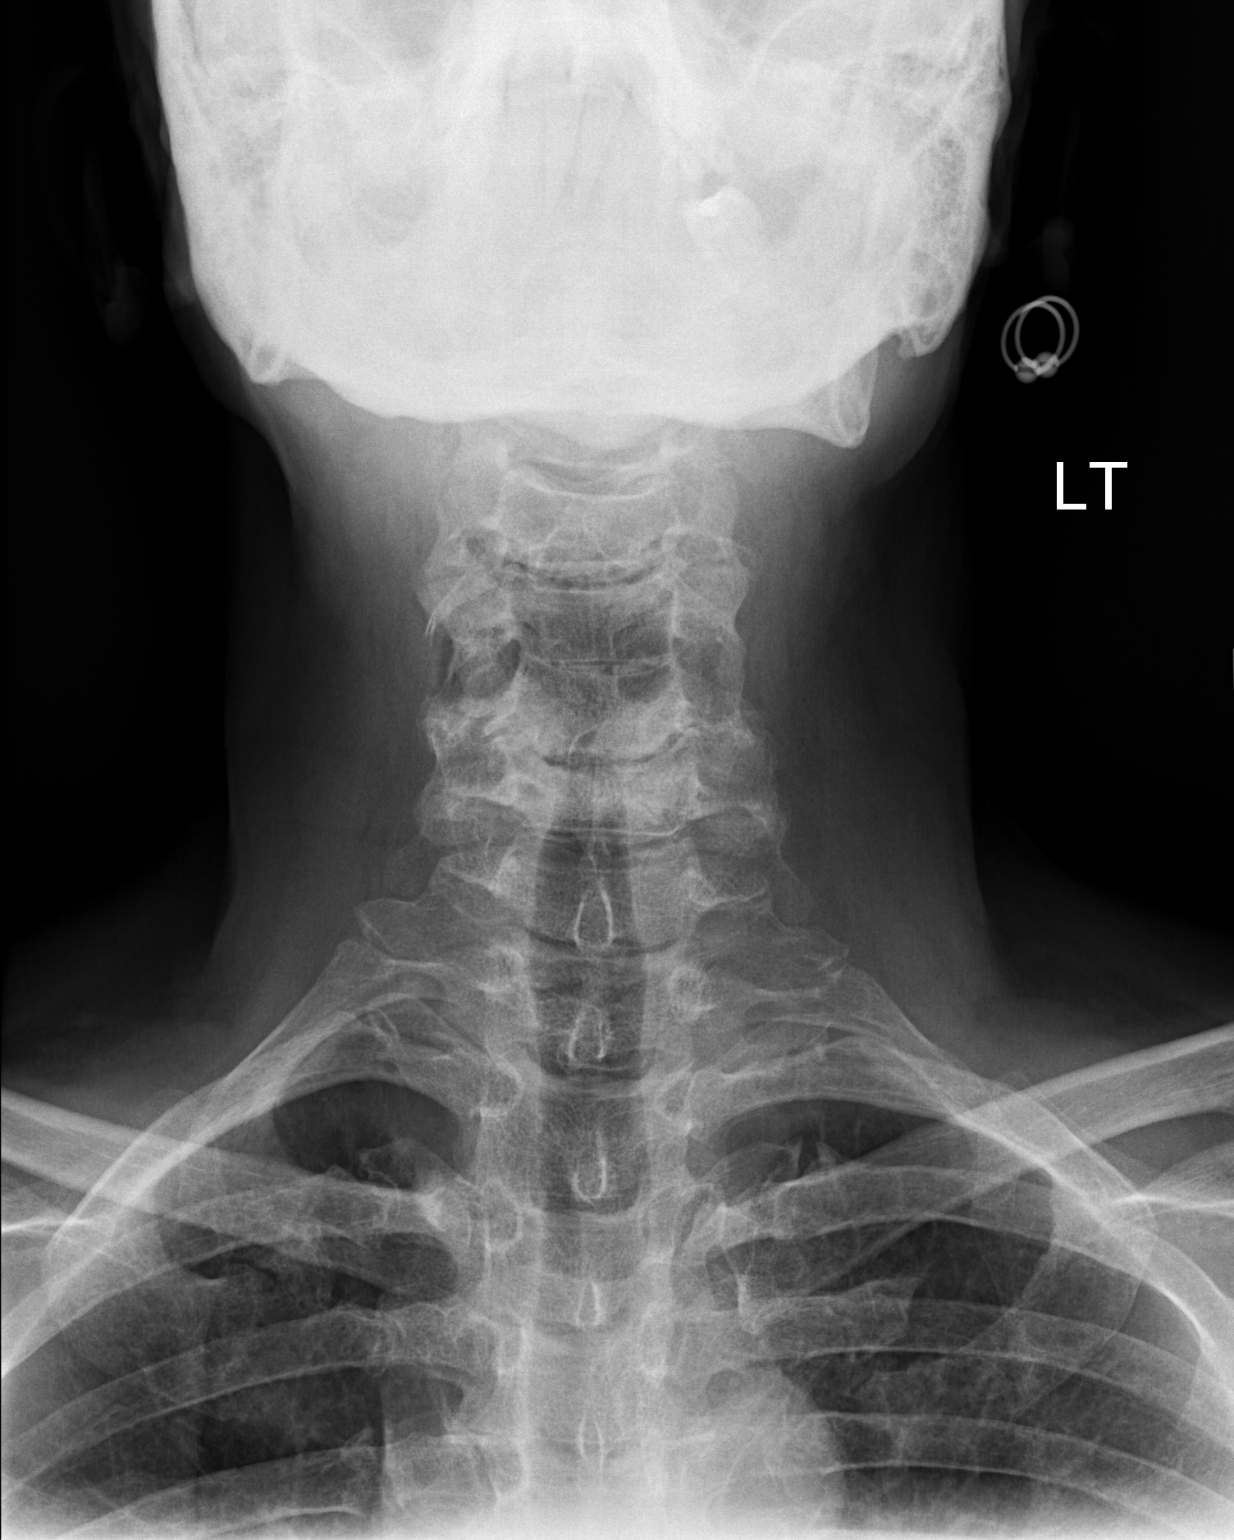

[w cervical spine odontoid]
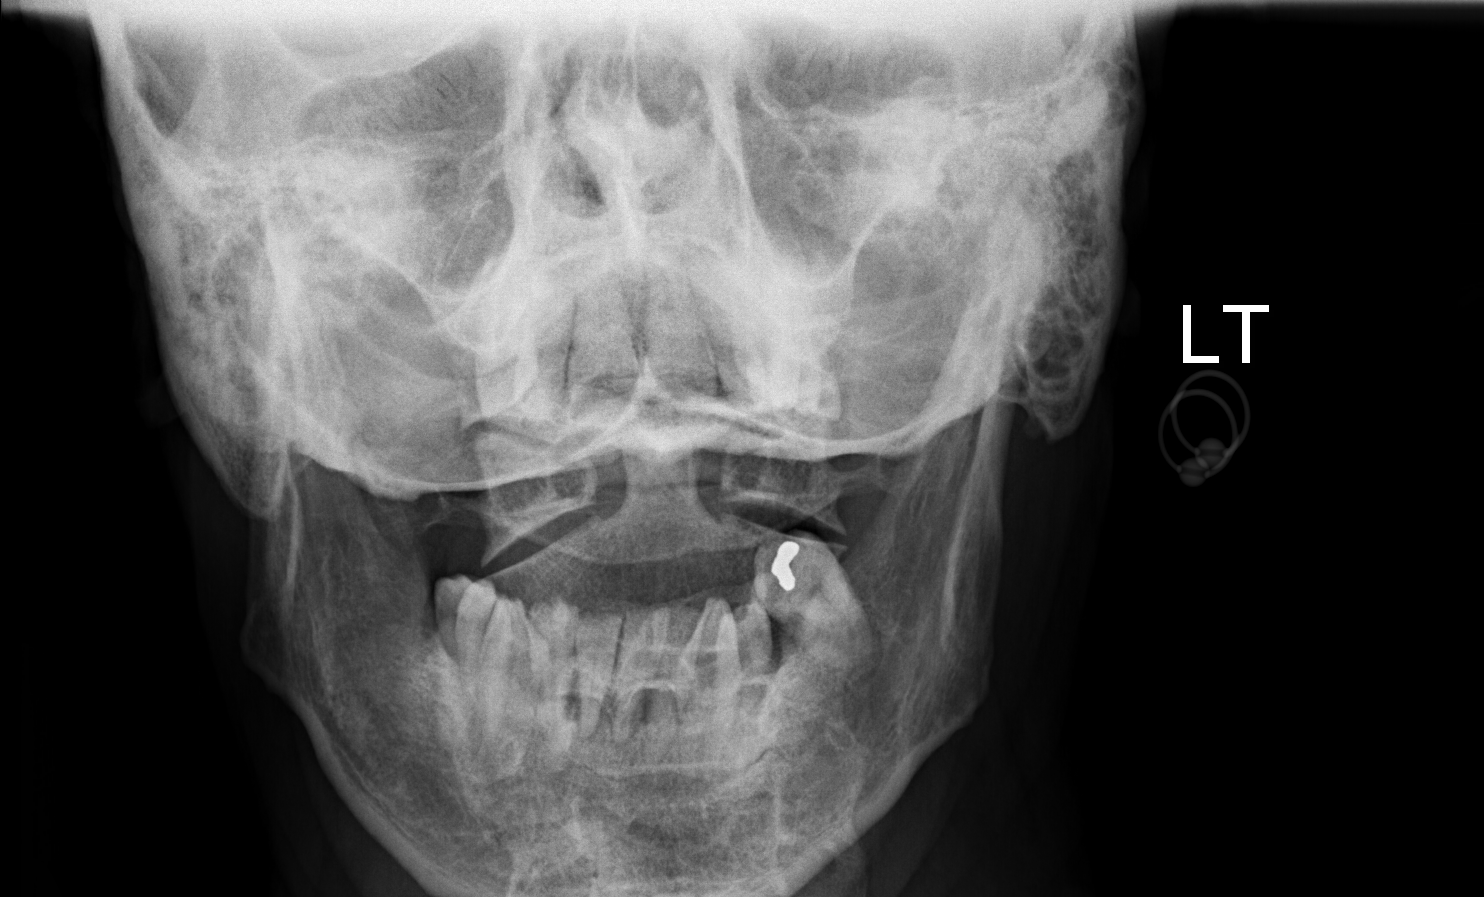

[3 of 3 positions shown; findings below may reference images not displayed]

FINDINGS: Normal alignment of the cervical spine. Congenital fusion of the
C4-C5 vertebrae. Prominent degenerative changes at C5-6 with large
posterior osteophyte. No prevertebral soft tissue swelling. No focal
bone lesion or bone destruction. C1-2 articulation appears intact.
IMPRESSION: Congenital fusion at C4-5 with prominent degenerative changes at
C5-6.

## 2022-08-16 ENCOUNTER — Inpatient Hospital Stay
Admission: RE | Admit: 2022-08-16 | Discharge: 2022-08-16 | Disposition: A | Discharge status: Home / Self Care | Payer: Self-pay | Source: Ambulatory Visit | Attending: Orthopedic Surgery | Admitting: Orthopedic Surgery

## 2022-08-16 ENCOUNTER — Other Ambulatory Visit: Payer: Self-pay

## 2022-08-16 DIAGNOSIS — Z049 Encounter for examination and observation for unspecified reason: Secondary | ICD-10-CM
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
## 2022-08-23 ENCOUNTER — Encounter: Payer: Self-pay | Admitting: Orthopedic Surgery

## 2022-08-23 ENCOUNTER — Ambulatory Visit (INDEPENDENT_AMBULATORY_CARE_PROVIDER_SITE_OTHER): Payer: Medicare Other | Admitting: Orthopedic Surgery

## 2022-08-23 VITALS — BP 115/68 | Ht 64.0 in | Wt 112.4 lb

## 2022-08-23 DIAGNOSIS — M4125 Other idiopathic scoliosis, thoracolumbar region: Secondary | ICD-10-CM

## 2022-08-23 DIAGNOSIS — Z981 Arthrodesis status: Secondary | ICD-10-CM

## 2022-08-23 DIAGNOSIS — M5412 Radiculopathy, cervical region: Secondary | ICD-10-CM | POA: Diagnosis not present

## 2022-08-23 DIAGNOSIS — M5416 Radiculopathy, lumbar region: Secondary | ICD-10-CM | POA: Diagnosis not present

## 2022-08-23 NOTE — Patient Instructions (Signed)
It was so nice to see you today. Thank you so much for coming in.    You have scoliosis- this may be contributing to your pain.   I ordered xrays of your neck and also full length scoliosis xrays. These need done at [facility]. Walk into medical mall and let them know you need to go to radiology. You do not need any appointment. The orders are in.   Once I have the xray results, we will call you to set up a phone visit with me.   Depending on results, we may revisit PT, discuss referral to pain management, or talk about further imaging.   Please do not hesitate to call if you have any questions or concerns. You can also message me in MyChart.   Drake Leach PA-C [phone]
